# Patient Record
Sex: Female | Born: 1985 | Hispanic: No | Marital: Married | Smoking: Never smoker
Health system: Southern US, Community
[De-identification: ages and names within clinical notes are randomized; demographics above are authoritative.]

## PROBLEM LIST (undated history)

## (undated) DIAGNOSIS — R42 Dizziness and giddiness: Secondary | ICD-10-CM

## (undated) DIAGNOSIS — I10 Essential (primary) hypertension: Secondary | ICD-10-CM

## (undated) DIAGNOSIS — E785 Hyperlipidemia, unspecified: Secondary | ICD-10-CM

## (undated) DIAGNOSIS — R079 Chest pain, unspecified: Secondary | ICD-10-CM

## (undated) DIAGNOSIS — R519 Headache, unspecified: Secondary | ICD-10-CM

## (undated) HISTORY — DX: Headache, unspecified: R51.9

## (undated) HISTORY — DX: Dizziness and giddiness: R42

## (undated) HISTORY — DX: Hyperlipidemia, unspecified: E78.5

## (undated) HISTORY — DX: Chest pain, unspecified: R07.9

---

## 2019-03-28 ENCOUNTER — Other Ambulatory Visit: Payer: Self-pay

## 2019-03-28 ENCOUNTER — Emergency Department (HOSPITAL_COMMUNITY)
Admission: EM | Admit: 2019-03-28 | Discharge: 2019-03-28 | Disposition: A | Discharge status: Home / Self Care | Payer: Self-pay | Attending: Emergency Medicine | Admitting: Emergency Medicine

## 2019-03-28 ENCOUNTER — Encounter (HOSPITAL_COMMUNITY): Payer: Self-pay | Admitting: Emergency Medicine

## 2019-03-28 DIAGNOSIS — F4321 Adjustment disorder with depressed mood: Secondary | ICD-10-CM | POA: Insufficient documentation

## 2019-03-28 DIAGNOSIS — Z659 Problem related to unspecified psychosocial circumstances: Secondary | ICD-10-CM | POA: Insufficient documentation

## 2019-03-28 DIAGNOSIS — I10 Essential (primary) hypertension: Secondary | ICD-10-CM | POA: Insufficient documentation

## 2019-03-28 DIAGNOSIS — R51 Headache: Secondary | ICD-10-CM | POA: Insufficient documentation

## 2019-03-28 DIAGNOSIS — R519 Headache, unspecified: Secondary | ICD-10-CM

## 2019-03-28 HISTORY — DX: Essential (primary) hypertension: I10

## 2019-03-28 LAB — CBC WITH DIFFERENTIAL/PLATELET
Abs Immature Granulocytes: 0.02 10*3/uL (ref 0.00–0.07)
Basophils Absolute: 0 10*3/uL (ref 0.0–0.1)
Basophils Relative: 0 %
Eosinophils Absolute: 0.1 10*3/uL (ref 0.0–0.5)
Eosinophils Relative: 1 %
HCT: 39.4 % (ref 36.0–46.0)
Hemoglobin: 12.8 g/dL (ref 12.0–15.0)
Immature Granulocytes: 0 %
Lymphocytes Relative: 41 %
Lymphs Abs: 3.5 10*3/uL (ref 0.7–4.0)
MCH: 27.9 pg (ref 26.0–34.0)
MCHC: 32.5 g/dL (ref 30.0–36.0)
MCV: 85.8 fL (ref 80.0–100.0)
Monocytes Absolute: 0.4 10*3/uL (ref 0.1–1.0)
Monocytes Relative: 5 %
Neutro Abs: 4.5 10*3/uL (ref 1.7–7.7)
Neutrophils Relative %: 53 %
Platelets: 299 10*3/uL (ref 150–400)
RBC: 4.59 MIL/uL (ref 3.87–5.11)
RDW: 12.3 % (ref 11.5–15.5)
WBC: 8.6 10*3/uL (ref 4.0–10.5)
nRBC: 0 % (ref 0.0–0.2)

## 2019-03-28 LAB — COMPREHENSIVE METABOLIC PANEL
ALT: 19 U/L (ref 0–44)
AST: 16 U/L (ref 15–41)
Albumin: 3.8 g/dL (ref 3.5–5.0)
Alkaline Phosphatase: 52 U/L (ref 38–126)
Anion gap: 9 (ref 5–15)
BUN: 19 mg/dL (ref 6–20)
CO2: 21 mmol/L — ABNORMAL LOW (ref 22–32)
Calcium: 9 mg/dL (ref 8.9–10.3)
Chloride: 107 mmol/L (ref 98–111)
Creatinine, Ser: 0.7 mg/dL (ref 0.44–1.00)
GFR calc Af Amer: >60 mL/min (ref >60)
GFR calc non Af Amer: >60 mL/min (ref >60)
Glucose, Bld: 108 mg/dL — ABNORMAL HIGH (ref 70–99)
Potassium: 3.9 mmol/L (ref 3.5–5.1)
Sodium: 137 mmol/L (ref 135–145)
Total Bilirubin: 0.6 mg/dL (ref 0.3–1.2)
Total Protein: 6.7 g/dL (ref 6.5–8.1)

## 2019-03-28 LAB — I-STAT BETA HCG BLOOD, ED (MC, WL, AP ONLY): I-stat hCG, quantitative: <5 m[IU]/mL (ref <5)

## 2019-03-28 MED ORDER — PROCHLORPERAZINE EDISYLATE 10 MG/2ML IJ SOLN
5.0000 mg | Freq: Once | INTRAMUSCULAR | Status: AC
  Administered 2019-03-28: 09:00:00 5 mg via INTRAMUSCULAR
  Filled 2019-03-28: qty 2

## 2019-03-28 MED ORDER — DIPHENHYDRAMINE HCL 50 MG/ML IJ SOLN
12.5000 mg | Freq: Once | INTRAMUSCULAR | Status: AC
  Administered 2019-03-28: 09:00:00 12.5 mg via INTRAVENOUS
  Filled 2019-03-28: qty 1

## 2019-03-28 MED ORDER — KETOROLAC TROMETHAMINE 15 MG/ML IJ SOLN
15.0000 mg | Freq: Once | INTRAMUSCULAR | Status: AC
  Administered 2019-03-28: 15 mg via INTRAVENOUS
  Filled 2019-03-28: qty 1

## 2019-03-28 MED ORDER — SODIUM CHLORIDE 0.9 % IV BOLUS
500.0000 mL | Freq: Once | INTRAVENOUS | Status: AC
  Administered 2019-03-28: 500 mL via INTRAVENOUS

## 2019-03-28 NOTE — ED Triage Notes (Signed)
Pt was brought into the ED w/ hypertension and a headache which she describes as "on fire". Pt has been taking her blood pressure medication however her mother died three days ago in [place].  Pt speaks Arabic.  No neuro deficits at this time.  Normal speech.

## 2019-03-28 NOTE — ED Provider Notes (Signed)
[facility] EMERGENCY DEPARTMENT Provider Note   CSN: [id number] Arrival date & time: 03/28/19  0404    History   Chief Complaint Chief Complaint  Patient presents with   Hypertension   Headache    HPI Breanna Simpson is a 33 y.o. female with a past medical history of obesity and hypertension who presents emergency department chief complaint of headache and high blood pressure.  History is limited by language barrier.  Professional translation services are utilized with Arabic translation.  The patient has been in the [place] for left approximately 7 months.  She states that she has been under significant stress.  She is a mother of 5 children.  She has been having some family stress with her husband.  She also lost her mother this year and has been grieving her loss.  She complains of daily headaches over the top of her head which she describes as aching.  She normally takes ibuprofen which improves the headache however it then returns.  She denies changes in vision, nausea, vomiting, photophobia, phonophobia.  She has not seen a doctor or neurologist about these headaches in the past.  She does get frequent tonsillitis and states that her headaches seem to worsen with them.  She is currently being treated with Augmentin for tonsillitis and denies any active sore throat pain.  She gets 3-4 episodes of tonsillitis yearly.  Patient states that she took her antihypertensive medication Diovan last night around midnight.  When she awoke this morning she felt as though her blood pressure was high.  She had a headache which she describes as "like tingling fire over the top of my head.  She felt heaviness in her tongue and had what she felt was racing heart on the left side of her chest along with left arm pain.  This lasted for only a short time and then resolved.  She denies any active chest pain.  She denies any rashes on her scalp.  She denies changes in vision, unilateral leg weakness,  difficulty with speech or swallowing.  She currently rates her headache at about a 7 out of 10.     HPI  Past Medical History:  Diagnosis Date   Hypertension     There are no active problems to display for this patient.   History reviewed. No pertinent surgical history.   OB History   No obstetric history on file.      Home Medications    Prior to Admission medications   Not on File    Family History No family history on file.  Social History Social History   Tobacco Use   Smoking status: Not on file  Substance Use Topics   Alcohol use: Not on file   Drug use: Not on file     Allergies   Patient has no known allergies.   Review of Systems Review of Systems  Ten systems reviewed and are negative for acute change, except as noted in the HPI.   Physical Exam Updated Vital Signs BP (!) 111/56    Pulse 72    Temp 99 F (37.2 C) (Oral)    Resp 14    Ht 5\' 3"  (1.6 m)    Wt 95 kg    SpO2 95%    BMI 37.10 kg/m   Physical Exam Vitals signs and nursing note reviewed.  Constitutional:      General: She is not in acute distress.    Appearance: She is well-developed. She  is not diaphoretic.  HENT:     Head: Normocephalic and atraumatic.  Eyes:     General: No scleral icterus.    Conjunctiva/sclera: Conjunctivae normal.     Pupils: Pupils are equal, round, and reactive to light.     Comments: No horizontal, vertical or rotational nystagmus  Neck:     Musculoskeletal: Normal range of motion and neck supple.     Comments: Full active and passive ROM without pain No midline or paraspinal tenderness No nuchal rigidity or meningeal signs Cardiovascular:     Rate and Rhythm: Normal rate and regular rhythm.  Pulmonary:     Effort: Pulmonary effort is normal. No respiratory distress.     Breath sounds: Normal breath sounds. No wheezing or rales.  Abdominal:     General: Bowel sounds are normal.     Palpations: Abdomen is soft.     Tenderness: There is no  abdominal tenderness. There is no guarding or rebound.  Musculoskeletal: Normal range of motion.  Lymphadenopathy:     Cervical: No cervical adenopathy.  Skin:    General: Skin is warm and dry.     Findings: No rash.  Neurological:     Mental Status: She is alert and oriented to person, place, and time.     Cranial Nerves: No cranial nerve deficit.     Motor: No abnormal muscle tone.     Coordination: Coordination normal.     Deep Tendon Reflexes: Reflexes are normal and symmetric.     Comments: Mental Status:  Alert, oriented, thought content appropriate. Speech fluent without evidence of aphasia. Able to follow 2 step commands without difficulty.  Cranial Nerves:  II:  Peripheral visual fields grossly normal, pupils equal, round, reactive to light III,IV, VI: ptosis not present, extra-ocular motions intact bilaterally  V,VII: smile symmetric, facial light touch sensation equal VIII: hearing grossly normal bilaterally  IX,X: midline uvula rise  XI: bilateral shoulder shrug equal and strong XII: midline tongue extension  Motor:  5/5 in upper and lower extremities bilaterally including strong and equal grip strength and dorsiflexion/plantar flexion Sensory: Pinprick and light touch normal in all extremities.  Deep Tendon Reflexes: 2+ and symmetric  Cerebellar: normal finger-to-nose with bilateral upper extremities Gait: normal gait and balance CV: distal pulses palpable throughout   Psychiatric:        Behavior: Behavior normal.        Thought Content: Thought content normal.        Judgment: Judgment normal.      ED Treatments / Results  Labs (all labs ordered are listed, but only abnormal results are displayed) Labs Reviewed  COMPREHENSIVE METABOLIC PANEL - Abnormal; Notable for the following components:      Result Value   CO2 21 (*)    Glucose, Bld 108 (*)    All other components within normal limits  CBC WITH DIFFERENTIAL/PLATELET  I-STAT BETA HCG BLOOD, ED ([facility],  [facility], [facility] ONLY)    EKG None  Radiology No results found.  Procedures Procedures (including critical care time)  Medications Ordered in ED Medications  sodium chloride 0.9 % bolus 500 mL (0 mLs Intravenous Stopped 03/28/19 1005)  prochlorperazine (COMPAZINE) injection 5 mg (5 mg Intramuscular Given 03/28/19 0904)  diphenhydrAMINE (BENADRYL) injection 12.5 mg (12.5 mg Intravenous Given 03/28/19 0904)  ketorolac (TORADOL) 15 MG/ML injection 15 mg (15 mg Intravenous Given 03/28/19 1006)     Initial Impression / Assessment and Plan / ED Course  I have reviewed the triage  vital signs and the nursing notes.  Pertinent labs & imaging results that were available during my care of the patient were reviewed by me and considered in my medical decision making (see chart for details).  Clinical Course as of Mar 28 1031  Tue Mar 28, 2019  0930 I-Stat beta hCG blood, ED [AH]    Clinical Course User Index [AH] Arthor CaptainHarris, Taisa Deloria, PA-C       Breanna Simpson is a 33 y.o. female who presents to ED for  headache. No focal neuro deficits on exam.  Migraine cocktail and fluids given. She has complete resolution of sxs at this time  On re-evaluation, patient feels much better. The patient denies any neurologic symptoms such as visual changes, focal numbness/weakness, balance problems, confusion, or speech difficulty to suggest a life-threatening intracranial process such as intracranial hemorrhage or mass. The patient has no clotting risk factors thus venous sinus thrombosis is unlikely. No fevers, neck pain or nuchal rigidity to suggest meningitis. I feel that the patient is safe for discharge home at this time. PCP follow up strongly encouraged. I have reviewed return precautions including development of neurologic symptoms, confusion, lethargy, difficulty speaking, or new/worsening/concerning symptoms. All questions answered.   Final Clinical Impressions(s) / ED Diagnoses   Final diagnoses:  Bad  headache  Other social stressor  Feeling grief    ED Discharge Orders    None       Arthor CaptainHarris, Khalie Wince, PA-C 03/28/19 1032    Raeford RazorKohut, Stephen, MD 03/29/19 0730

## 2019-03-28 NOTE — Discharge Instructions (Signed)

## 2019-03-28 NOTE — ED Notes (Signed)
Patient verbalizes understanding of discharge instructions. Opportunity for questioning and answers were provided. Armband removed by staff, pt discharged from ED.  

## 2020-06-06 DIAGNOSIS — I10 Essential (primary) hypertension: Secondary | ICD-10-CM | POA: Insufficient documentation

## 2020-10-31 ENCOUNTER — Emergency Department (HOSPITAL_COMMUNITY)
Admission: EM | Admit: 2020-10-31 | Discharge: 2020-10-31 | Disposition: A | Discharge status: Home / Self Care | Payer: 59 | Attending: Emergency Medicine | Admitting: Emergency Medicine

## 2020-10-31 ENCOUNTER — Emergency Department (HOSPITAL_COMMUNITY): Payer: 59

## 2020-10-31 ENCOUNTER — Encounter (HOSPITAL_COMMUNITY): Payer: Self-pay | Admitting: Emergency Medicine

## 2020-10-31 DIAGNOSIS — R079 Chest pain, unspecified: Secondary | ICD-10-CM | POA: Insufficient documentation

## 2020-10-31 DIAGNOSIS — Z79899 Other long term (current) drug therapy: Secondary | ICD-10-CM | POA: Insufficient documentation

## 2020-10-31 DIAGNOSIS — I10 Essential (primary) hypertension: Secondary | ICD-10-CM | POA: Diagnosis not present

## 2020-10-31 DIAGNOSIS — R519 Headache, unspecified: Secondary | ICD-10-CM | POA: Insufficient documentation

## 2020-10-31 LAB — BASIC METABOLIC PANEL
Anion gap: 12 (ref 5–15)
BUN: 7 mg/dL (ref 6–20)
CO2: 22 mmol/L (ref 22–32)
Calcium: 9.4 mg/dL (ref 8.9–10.3)
Chloride: 107 mmol/L (ref 98–111)
Creatinine, Ser: 0.55 mg/dL (ref 0.44–1.00)
GFR, Estimated: >60 mL/min (ref >60)
Glucose, Bld: 95 mg/dL (ref 70–99)
Potassium: 3.8 mmol/L (ref 3.5–5.1)
Sodium: 141 mmol/L (ref 135–145)

## 2020-10-31 LAB — CBC
HCT: 41.9 % (ref 36.0–46.0)
Hemoglobin: 13.8 g/dL (ref 12.0–15.0)
MCH: 28.2 pg (ref 26.0–34.0)
MCHC: 32.9 g/dL (ref 30.0–36.0)
MCV: 85.5 fL (ref 80.0–100.0)
Platelets: 279 10*3/uL (ref 150–400)
RBC: 4.9 MIL/uL (ref 3.87–5.11)
RDW: 12.5 % (ref 11.5–15.5)
WBC: 8.8 10*3/uL (ref 4.0–10.5)
nRBC: 0 % (ref 0.0–0.2)

## 2020-10-31 LAB — I-STAT BETA HCG BLOOD, ED (MC, WL, AP ONLY): I-stat hCG, quantitative: <5 m[IU]/mL (ref <5)

## 2020-10-31 LAB — TROPONIN I (HIGH SENSITIVITY): Troponin I (High Sensitivity): 3 ng/L (ref <18)

## 2020-10-31 MED ORDER — BUTALBITAL-APAP-CAFFEINE 50-325-40 MG PO TABS: 1.0000 | ORAL_TABLET | Freq: Four times a day (QID) | ORAL | 0 refills | Status: AC | PRN

## 2020-10-31 MED ORDER — DIPHENHYDRAMINE HCL 25 MG PO CAPS
25.0000 mg | ORAL_CAPSULE | Freq: Once | ORAL | Status: AC
  Administered 2020-10-31: 25 mg via ORAL
  Filled 2020-10-31: qty 1

## 2020-10-31 MED ORDER — PROCHLORPERAZINE MALEATE 5 MG PO TABS
5.0000 mg | ORAL_TABLET | Freq: Once | ORAL | Status: AC
  Administered 2020-10-31: 5 mg via ORAL
  Filled 2020-10-31: qty 1

## 2020-10-31 MED ORDER — IBUPROFEN 400 MG PO TABS
600.0000 mg | ORAL_TABLET | Freq: Once | ORAL | Status: AC
  Administered 2020-10-31: 600 mg via ORAL
  Filled 2020-10-31: qty 1

## 2020-10-31 MED ORDER — IRBESARTAN 150 MG PO TABS: 150.0000 mg | ORAL_TABLET | Freq: Every day | ORAL | 0 refills | Status: DC

## 2020-10-31 MED ORDER — ACETAMINOPHEN 500 MG PO TABS
1000.0000 mg | ORAL_TABLET | Freq: Once | ORAL | Status: AC
  Administered 2020-10-31: 1000 mg via ORAL
  Filled 2020-10-31: qty 2

## 2020-10-31 NOTE — Discharge Instructions (Addendum)
You were seen in the ER for headache, chest pain, ear pain, tongue and leg heaviness, elevated blood pressure reading  All ER work up today was normal  You chose oral headache medicines over IV or injection of headache medicines  Your blood pressure improved here  The cause of your symptoms is unclear - it may be related to a virus, intermittent high blood pressures, stress, etc   I have increased your blood pressure medicine. Take 150 mg daily by mouth.  If you develop a headache or chest pain, take 600 mg ibuprofen and/or 650 mg acetaminophen.  Rest. If headache is still present, take Fioricet 1 pill.  Rest.  Check your blood pressure when you are not having any pain - pain will make blood pressure be high. Sit for 5 min with feet touching the ground, and make sure you are using the correct blood pressure cuff size Call your primary care doctor and make an appointment in 1 week for more discussion of your headaches, chest pain and blood pressure. You may need more changes in blood pressure medicine, cardiology or neurology referrals. Return for sudden, severe, constant headache, stroke symptoms, fevers Return for chest pain or exertion with exertion, cough

## 2020-10-31 NOTE — ED Triage Notes (Signed)
Pt here from a [facility] with c/o cp , feels like a burning in her chest , no sob or nausea , hx of htn

## 2020-10-31 NOTE — ED Provider Notes (Signed)
[facility] EMERGENCY DEPARTMENT Provider Note   CSN: [id number] Arrival date & time: 10/31/20  1356     History No chief complaint on file.   Breanna Simpson is a 35 y.o. female with past medical history of hypertension presents to the ED for evaluation of several concerns.  States last night around midnight she was trying to sleep in bed in developed headache, chest pain.  She felt her heart racing.  Noticed that her tongue felt "heavy" and her legs felt heavy as well.  She checked her blood pressure and it was high but does not remember the number.  She took 2 of her blood pressure medicines irbesartan and 1 baby aspirin.  States she was not able to fall asleep.  Woke up this morning and around 9 AM had return of symptoms so she took one of her blood pressure medicines and a baby aspirin again.  States her blood pressure went down but her symptoms have persisted.  Continues to have chest pain and headache now 6/10.  Describes her headache as frontal, pressure, gradual.  Denies head trauma, visual changes, fevers, neck pain, stroke symptoms.  Her chest pain is central with some radiation to the left breast described as a squeeze, constant.  Chest pain is worse when she lays down.  No changes in chest pain with deep breathing, eating or exertion.  Patient states she has had similar symptoms at least 3-4 times in the last few years.  Came to the ED 1 year ago with headache, chest pain and "heaviness" of her tongue.  She was given a migraine cocktail and her symptoms improved.  She has a primary care doctor at [facility] who prescribes her her blood pressure medicine.  She has never had a formal cardiology or neurology evaluation for her headaches or chest pains.  States 1 week ago she had a sore throat, runny and congested nose, nausea, subjective fevers.  Her daughter tested positive for Covid January 15.  She had 2 Covid vaccines April 2021 and no booster.   States she has had 3 Covid test so far and they are negative.  Went to urgent care earlier today for her symptoms and was tested for Covid again and it was negative.  Over the last 2 to 3 days she has noticed a mildly productive cough with clear phlegm.  Denies fever, shortness of breath, hemoptysis.  No nausea, vomiting, abdominal pain, diarrhea.  No changes in taste or smell. Also having bilateral ear pain.  No interventions. BP is 139/92 during encounter.   HPI     Past Medical History:  Diagnosis Date  . Hypertension     There are no problems to display for this patient.   History reviewed. No pertinent surgical history.   OB History   No obstetric history on file.     No family history on file.     Home Medications Prior to Admission medications   Medication Sig Start Date End Date Taking? Authorizing Provider  butalbital-acetaminophen-caffeine (FIORICET) 50-325-40 MG tablet Take 1 tablet by mouth every 6 (six) hours as needed for headache. 10/31/20 10/31/21 Yes Liberty Handy, PA-C  irbesartan (AVAPRO) 150 MG tablet Take 1 tablet (150 mg total) by mouth daily. 10/31/20 11/30/20 Yes Liberty Handy, PA-C    Allergies    Patient has no known allergies.  Review of Systems   Review of Systems  Constitutional: Positive for fever (subjective, resolved).  HENT: Positive  for congestion, ear pain (bilateral), rhinorrhea and sore throat (resolved).   Respiratory: Positive for cough.   Cardiovascular: Positive for chest pain.  Neurological: Positive for headaches.    Physical Exam Updated Vital Signs BP 127/77   Pulse 65   Temp 98.7 F (37.1 C) (Oral)   Resp (!) 23   Ht 5\' 4"  (1.626 m)   Wt 93 kg   LMP 10/31/2020 (Exact Date)   SpO2 100%   BMI 35.19 kg/m   Physical Exam Constitutional:      Appearance: She is well-developed.     Comments: NAD. Non toxic. Husband at bedside   HENT:     Head: Normocephalic and atraumatic.     Ears:     Comments: Minimal  white effusion bilaterally, still able to visualize bony landmark. No TM bulging, erythema, perforation. Middle ear/external ear normal     Nose: Nose normal.     Mouth/Throat:     Comments: MMM. Oropharynx and tonsils normal, no erythema.  Eyes:     General: Lids are normal.     Conjunctiva/sclera: Conjunctivae normal.  Neck:     Trachea: Trachea normal.     Comments: Trachea midline.  Cardiovascular:     Rate and Rhythm: Normal rate and regular rhythm.     Pulses:          Radial pulses are 1+ on the right side and 1+ on the left side.       Dorsalis pedis pulses are 1+ on the right side and 1+ on the left side.     Heart sounds: Normal heart sounds, S1 normal and S2 normal.     Comments: No murmurs. No LE edema or calf tenderness.  Pulmonary:     Effort: Pulmonary effort is normal.     Breath sounds: Normal breath sounds.     Comments: Left substernal chest wall tenderness, skin normal over this area Chest:     Chest wall: Tenderness present.  Abdominal:     General: Bowel sounds are normal.     Palpations: Abdomen is soft.     Tenderness: There is no abdominal tenderness.     Comments: No epigastric or upper abdominal tenderness.  Musculoskeletal:     Cervical back: Normal range of motion.  Skin:    General: Skin is warm and dry.     Capillary Refill: Capillary refill takes less than 2 seconds.     Comments: No rash to chest wall  Neurological:     Mental Status: She is alert.     GCS: GCS eye subscore is 4. GCS verbal subscore is 5. GCS motor subscore is 6.     Comments:  Awake, alert. Speech appears clear - speaking in Arabic. Sensation to light touch intact in face, upper/lower extremities. Strength equal and symmetric bilaterally. No arm or leg drop/drift. Normal FTN and HTS. CN 2-12 intact.   Psychiatric:        Speech: Speech normal.        Behavior: Behavior normal.        Thought Content: Thought content normal.     ED Results / Procedures / Treatments    Labs (all labs ordered are listed, but only abnormal results are displayed) Labs Reviewed  BASIC METABOLIC PANEL  CBC  I-STAT BETA HCG BLOOD, ED ([facility], [facility], [facility] ONLY)  TROPONIN I (HIGH SENSITIVITY)  TROPONIN I (HIGH SENSITIVITY)    EKG EKG Interpretation  Date/Time:  Thursday October 31 2020 14:08:10 EST  Ventricular Rate:  67 PR Interval:  132 QRS Duration: 74 QT Interval:  408 QTC Calculation: 431 R Axis:   20 Text Interpretation: Normal sinus rhythm Cannot rule out Anterior infarct , age undetermined Abnormal ECG No STEMI Confirmed by Alona Bene [phone]) on 10/31/2020 5:49:01 PM   Radiology DG Chest 2 View  Result Date: 10/31/2020 CLINICAL DATA:  Chest pain EXAM: CHEST - 2 VIEW COMPARISON:  None. FINDINGS: Lungs are clear. Heart size and pulmonary vascularity are normal. No adenopathy. No pneumothorax. No bone lesions. Surgical clips noted in right upper abdomen. IMPRESSION: Lungs clear.  Cardiac silhouette normal. Electronically Signed   By: Bretta Bang III M.D.   On: 10/31/2020 14:50    Procedures Procedures   Medications Ordered in ED Medications  acetaminophen (TYLENOL) tablet 1,000 mg (1,000 mg Oral Given 10/31/20 1906)  ibuprofen (ADVIL) tablet 600 mg (600 mg Oral Given 10/31/20 1906)  prochlorperazine (COMPAZINE) tablet 5 mg (5 mg Oral Given 10/31/20 1906)  diphenhydrAMINE (BENADRYL) capsule 25 mg (25 mg Oral Given 10/31/20 1906)    ED Course  I have reviewed the triage vital signs and the nursing notes.  Pertinent labs & imaging results that were available during my care of the patient were reviewed by me and considered in my medical decision making (see chart for details).    MDM Rules/Calculators/A&P                           35 y.o. yo with chief complaint of headache, chest pain, bilateral ear pain, tongue and bilateral leg "heaviness" as well as elevated blood pressure reading since last night.  Reports recurrent headache, chest pain and elevated blood  pressure 3-4 times in the past few years.  Blood pressure readings at home in the 170 systolic.  She took blood pressure medicine on arrival her blood pressure is significantly improved.  Also reports 1 week ago had URI symptoms and daughter tested positive for Covid.  Previous medical records available, triage and nursing notes reviewed to obtain more history and assist with MDM  EMR reveals patient presented to the ED with elevated blood pressure reading, tongue "heaviness", headache and chest pain 1 year ago.  She received migraine cocktail at that time and symptoms improved.  ER lab work and imaging ordered by triage RN and me, as above  I have personally visualized and interpreted ER diagnostic work up including labs and imaging.    Labs reveal -all normal.  Normal WBC, hemoglobin.  Undetectable troponin.  Patient declined Covid testing.  Imaging reveals -chest x-ray without acute findings.  EKG without ischemia, arrhythmias, signs of right ventricular strain.  No classic signs of pericarditis.  Given chronicity of headache, all other constellation of symptoms considered ICH, stroke highly unlikely today. Will defer neuro imaging.   Medications ordered - Patient reported 6/10 headache and chest pain.  Recommended IV migraine cocktail, anti-inflammatories but she declined and requested oral medicines.  Oral Tylenol, Benadryl, Advil and Compazine given.  Ordered continuous cardiac and pulse ox monitoring.  Will plan for serial re-examinations. Close monitoring.   Re-evaluated the patient.   She had improvement in headache and chest pain.  Blood pressure has completely normalized after pain control.  Overall, clinical presentation and ER work up most suggestive of atypical chest pain, recurring headache/migraine.  Given reassuring exam, chronicity of symptoms over the last few years, vastly reassuring lab work and imaging in the ED with undetectable troponins doubt  ACS, myocarditis, dissection.   PERC negative.?  Pericarditis or muscular chest pain as she has reproducible chest wall tenderness.  She has had headaches for several years and wonder if she has classic migraines.  Discussed with patient I will increase her blood pressure medicine.  Instructed her to not take her blood pressure routinely at home.  Educated patient on how to correctly take her blood pressure at home.  Instructed her to take Fioricet for any headaches as needed.  Recommended PCP follow-up for blood pressure medicine adjustment, she may benefit from referral to neurology for headaches and cardiology for recurrent atypical chest pains.  Return precautions discussed with patient and husband who are in agreement with plan.  Final Clinical Impression(s) / ED Diagnoses Final diagnoses:  Recurrent chest pain  Recurrent headache  Elevated blood pressure reading with diagnosis of hypertension    Rx / DC Orders ED Discharge Orders         Ordered    butalbital-acetaminophen-caffeine (FIORICET) 50-325-40 MG tablet  Every 6 hours PRN        10/31/20 1945    irbesartan (AVAPRO) 150 MG tablet  Daily        10/31/20 1949           Jerrell Mylar 10/31/20 2051    Maia Plan, MD 11/01/20 1153

## 2020-11-25 DIAGNOSIS — E785 Hyperlipidemia, unspecified: Secondary | ICD-10-CM | POA: Insufficient documentation

## 2020-11-25 DIAGNOSIS — R519 Headache, unspecified: Secondary | ICD-10-CM | POA: Insufficient documentation

## 2020-11-25 DIAGNOSIS — Z8639 Personal history of other endocrine, nutritional and metabolic disease: Secondary | ICD-10-CM | POA: Insufficient documentation

## 2020-11-26 ENCOUNTER — Encounter: Payer: Self-pay | Admitting: Neurology

## 2020-11-29 ENCOUNTER — Telehealth: Payer: Self-pay

## 2020-11-29 NOTE — Telephone Encounter (Signed)
NOTES ON FILE FROM  [facility] [phone], SENT REFERRAL TO SCHEDULING

## 2020-12-22 NOTE — Progress Notes (Signed)
Cardiology Office Note:    Date:  12/24/2020   ID:  Breanna Simpson, DOB [birth year], MRN [id number]  PCP:  [facility], [facility]   [facility]  Cardiologist:  No primary care provider on file.  Advanced Practice Provider:  No care team member to display Electrophysiologist:  None    Referring MD: Sherral Hammers, FNP     History of Present Illness:    Breanna Simpson is a 35 y.o. female with a hx of HTN and HLD who was referred by Verdene Lennert for chest pain and palpitations.  Had ER visit on 10/31/20. Note reviewed. Had episode of chest pain, HA and palpitations. Symptoms were non-exertional in nature. Also noted to have Bps in 170s at that time. In the ER, trop negative, CXR without acute findings. ECG with NSR. Received migraine cocktail with improvement and discharged home.  Since being in the ER, her chest pain as resolved but has continued palpitations. Has associated lightheadedness. Each episode lasts about and then goes away. Occurring 2-3 times per week. No chest pain at this time. No LE edema, orthopnea, PND. No exercise limitations. No syncope.   No known family history of heart disease.   Past Medical History:  Diagnosis Date  . Chest pain   . Dizziness   . Headache   . Hyperlipidemia   . Hypertension     History reviewed. No pertinent surgical history.  Current Medications: Current Meds  Medication Sig  . butalbital-acetaminophen-caffeine (FIORICET) 50-325-40 MG tablet Take 1 tablet by mouth every 6 (six) hours as needed for headache.  . cetirizine (ZYRTEC) 10 MG tablet Take 10 mg by mouth daily.  . irbesartan (AVAPRO) 150 MG tablet Take 1 tablet (150 mg total) by mouth daily.     Allergies:   Patient has no known allergies.   Social History   Socioeconomic History  . Marital status: Married    Spouse name: Not on file  . Number of children: Not on file  . Years of education: Not on file  .  Highest education level: Not on file  Occupational History  . Not on file  Tobacco Use  . Smoking status: Never Smoker  . Smokeless tobacco: Never Used  Substance and Sexual Activity  . Alcohol use: Never  . Drug use: Never  . Sexual activity: Not on file  Other Topics Concern  . Not on file  Social History Narrative  . Not on file   Social Determinants of Health   Financial Resource Strain: Not on file  Food Insecurity: Not on file  Transportation Needs: Not on file  Physical Activity: Not on file  Stress: Not on file  Social Connections: Not on file     Family History: The patient's family history includes Cancer - Colon in her father; Hypertension in her mother.  ROS:   Please see the history of present illness.    Review of Systems  Constitutional: Negative for chills and fever.  HENT: Negative for hearing loss and sore throat.   Eyes: Negative for blurred vision and redness.  Respiratory: Positive for shortness of breath. Negative for cough.   Cardiovascular: Positive for palpitations. Negative for chest pain, orthopnea, claudication, leg swelling and PND.  Gastrointestinal: Negative for melena, nausea and vomiting.  Genitourinary: Negative for dysuria and flank pain.  Musculoskeletal: Negative for falls and myalgias.  Neurological: Negative for dizziness and loss of consciousness.  Endo/Heme/Allergies: Negative for polydipsia.  Psychiatric/Behavioral: Negative for substance  abuse.    EKGs/Labs/Other Studies Reviewed:    The following studies were reviewed today: No cardiac studies  EKG:  EKG 11/01/20; NSR with no ischemia or block  Recent Labs: 10/31/2020: BUN 7; Creatinine, Ser 0.55; Hemoglobin 13.8; Platelets 279; Potassium 3.8; Sodium 141  Recent Lipid Panel No results found for: CHOL, TRIG, HDL, CHOLHDL, VLDL, LDLCALC, LDLDIRECT    Physical Exam:    VS:  BP 118/80   Pulse 63   Ht 5\' 3"  (1.6 m)   Wt 209 lb 6.4 oz (95 kg)   SpO2 99%   BMI 37.09  kg/m     Wt Readings from Last 3 Encounters:  12/24/20 209 lb 6.4 oz (95 kg)  10/31/20 205 lb (93 kg)  03/28/19 209 lb 7 oz (95 kg)     GEN:  Well nourished, well developed in no acute distress HEENT: Normal NECK: No JVD; No carotid bruits CARDIAC: RRR, no murmurs, rubs, gallops RESPIRATORY:  Clear to auscultation without rales, wheezing or rhonchi  ABDOMEN: Soft, non-tender, non-distended MUSCULOSKELETAL:  No edema; No deformity  SKIN: Warm and dry NEUROLOGIC:  Alert and oriented x 3 PSYCHIATRIC:  Normal affect   ASSESSMENT:    1. Palpitations   2. Chest pain of uncertain etiology    PLAN:    In order of problems listed above:  #Palpitations: Patient with episodes of palpitations with associated lightheadedness that has been ongoing for the past couple of months. Each episode comes on suddenly and lasts about 03/30/19 before going away. Has been occurring about 2-3 times per week. Symptoms are not exertional. No associated SOB or syncope. -Check 14 day zio  #Atypical Chest Pain: Non-exertional and has since resolved. No further episodes and ER work-up including troponin, ECG and CXR reassuring. -Continue to monitor   Medication Adjustments/Labs and Tests Ordered: Current medicines are reviewed at length with the patient today.  Concerns regarding medicines are outlined above.  Orders Placed This Encounter  Procedures  . LONG TERM MONITOR (3-14 DAYS)   No orders of the defined types were placed in this encounter.   Patient Instructions  Medication Instructions:  Your provider recommends that you continue on your current medications as directed. Please refer to the Current Medication list given to you today.   *If you need a refill on your cardiac medications before your next appointment, please call your pharmacy*  Testing/Procedures: Dr. recommends you wear a HEART MONITOR for 14 days. This will be mailed to your home. Leave the monitor on at all  times.  Follow-Up: Your provider recommends that you schedule a follow-up appointment AS NEEDED pending study results.   ZIO XT- Long Term Monitor Instructions   Your physician has requested you wear your ZIO patch monitor 14 days.   This is a single patch monitor.  [employer] supplies one patch monitor per enrollment.  Additional stickers are not available.   Please do not apply patch if you will be having a Nuclear Stress Test, Echocardiogram, Cardiac CT, MRI, or Chest Xray during the time frame you would be wearing the monitor. The patch cannot be worn during these tests.  You cannot remove and re-apply the ZIO XT patch monitor.   Your ZIO patch monitor will be sent USPS Priority mail from [facility] directly to your home address. The monitor may also be mailed to a PO BOX if home delivery is not available.   It may take 3-5 days to receive your monitor after you have been enrolled.  Once you have received you monitor, please review enclosed instructions.  Your monitor has already been registered assigning a specific monitor serial # to you.   Applying the monitor   Shave hair from upper left chest.   Hold abrader disc by orange tab.  Rub abrader in 40 strokes over left upper chest as indicated in your monitor instructions.   Clean area with 4 enclosed alcohol pads .  Use all pads to assure are is cleaned thoroughly.  Let dry.   Apply patch as indicated in monitor instructions.  Patch will be place under collarbone on left side of chest with arrow pointing upward.   Rub patch adhesive wings for 2 minutes.Remove white label marked "1".  Remove white label marked "2".  Rub patch adhesive wings for 2 additional minutes.   While looking in a mirror, press and release button in center of patch.  A small green light will flash 3-4 times .  This will be your only indicator the monitor has been turned on.     Do not shower for the first 24 hours.  You may shower after the first 24  hours.   Press button if you feel a symptom. You will hear a small click.  Record Date, Time and Symptom in the Patient Log Book.   When you are ready to remove patch, follow instructions on last 2 pages of Patient Log Book.  Stick patch monitor onto last page of Patient Log Book.   Place Patient Log Book in [place] box.  Use locking tab on box and tape box closed securely.  The [employer] and [employer] has [employer] on it.  Please place in mailbox as soon as possible.  Your physician should have your test results approximately 7 days after the monitor has been mailed back to [facility].   Call [facility] Customer Care at [phone] if you have questions regarding your ZIO XT patch monitor.  Call them immediately if you see an orange light blinking on your monitor.   If your monitor falls off in less than 4 days contact our Monitor department at [phone].  If your monitor becomes loose or falls off after 4 days call [employer] at [phone] for suggestions on securing your monitor.      Signed, Meriam Sprague, MD  12/24/2020 1:01 PM    [facility]

## 2020-12-24 ENCOUNTER — Other Ambulatory Visit: Payer: Self-pay

## 2020-12-24 ENCOUNTER — Encounter: Payer: Self-pay | Admitting: *Deleted

## 2020-12-24 ENCOUNTER — Ambulatory Visit (INDEPENDENT_AMBULATORY_CARE_PROVIDER_SITE_OTHER): Payer: 59 | Admitting: Cardiology

## 2020-12-24 ENCOUNTER — Encounter: Payer: Self-pay | Admitting: Cardiology

## 2020-12-24 ENCOUNTER — Ambulatory Visit (INDEPENDENT_AMBULATORY_CARE_PROVIDER_SITE_OTHER): Payer: 59

## 2020-12-24 VITALS — BP 118/80 | HR 63 | Ht 63.0 in | Wt 209.4 lb

## 2020-12-24 DIAGNOSIS — R002 Palpitations: Secondary | ICD-10-CM | POA: Diagnosis not present

## 2020-12-24 DIAGNOSIS — R079 Chest pain, unspecified: Secondary | ICD-10-CM | POA: Diagnosis not present

## 2020-12-24 NOTE — Progress Notes (Signed)
Patient ID: Breanna Simpson, female   DOB: 1986/02/15, 35 y.o.   MRN: [id number] [employer] (ZIO XT), is not in network with patients insurance, [employer]. Patient will be enrolled for [employer] to ship a 14 day long term holter monitor to her home.  [employer] is in network with [employer].

## 2020-12-24 NOTE — Progress Notes (Signed)
Confirmed with monitor technician the patient will receive instructions in Arabic.

## 2020-12-24 NOTE — Patient Instructions (Signed)
Medication Instructions:  Your provider recommends that you continue on your current medications as directed. Please refer to the Current Medication list given to you today.   *If you need a refill on your cardiac medications before your next appointment, please call your pharmacy*  Testing/Procedures: Dr. Shari Prows recommends you wear a HEART MONITOR for 14 days. This will be mailed to your home. Leave the monitor on at all times.  Follow-Up: Your provider recommends that you schedule a follow-up appointment AS NEEDED pending study results.   ZIO XT- Long Term Monitor Instructions   Your physician has requested you wear your ZIO patch monitor 14 days.   This is a single patch monitor.  [employer] supplies one patch monitor per enrollment.  Additional stickers are not available.   Please do not apply patch if you will be having a Nuclear Stress Test, Echocardiogram, Cardiac CT, MRI, or Chest Xray during the time frame you would be wearing the monitor. The patch cannot be worn during these tests.  You cannot remove and re-apply the ZIO XT patch monitor.   Your ZIO patch monitor will be sent USPS Priority mail from [facility] directly to your home address. The monitor may also be mailed to a PO BOX if home delivery is not available.   It may take 3-5 days to receive your monitor after you have been enrolled.   Once you have received you monitor, please review enclosed instructions.  Your monitor has already been registered assigning a specific monitor serial # to you.   Applying the monitor   Shave hair from upper left chest.   Hold abrader disc by orange tab.  Rub abrader in 40 strokes over left upper chest as indicated in your monitor instructions.   Clean area with 4 enclosed alcohol pads .  Use all pads to assure are is cleaned thoroughly.  Let dry.   Apply patch as indicated in monitor instructions.  Patch will be place under collarbone on left side of chest with arrow  pointing upward.   Rub patch adhesive wings for 2 minutes.Remove white label marked "1".  Remove white label marked "2".  Rub patch adhesive wings for 2 additional minutes.   While looking in a mirror, press and release button in center of patch.  A small green light will flash 3-4 times .  This will be your only indicator the monitor has been turned on.     Do not shower for the first 24 hours.  You may shower after the first 24 hours.   Press button if you feel a symptom. You will hear a small click.  Record Date, Time and Symptom in the Patient Log Book.   When you are ready to remove patch, follow instructions on last 2 pages of Patient Log Book.  Stick patch monitor onto last page of Patient Log Book.   Place Patient Log Book in [place] box.  Use locking tab on box and tape box closed securely.  The [employer] and [employer] has [employer] on it.  Please place in mailbox as soon as possible.  Your physician should have your test results approximately 7 days after the monitor has been mailed back to [facility].   Call [facility] Customer Care at [phone] if you have questions regarding your ZIO XT patch monitor.  Call them immediately if you see an orange light blinking on your monitor.   If your monitor falls off in less than 4 days contact our Monitor  department at [phone].  If your monitor becomes loose or falls off after 4 days call [employer] at [phone] for suggestions on securing your monitor.

## 2021-01-14 DIAGNOSIS — R002 Palpitations: Secondary | ICD-10-CM

## 2021-01-30 ENCOUNTER — Ambulatory Visit: Payer: 59 | Admitting: Neurology

## 2021-01-31 ENCOUNTER — Telehealth: Payer: Self-pay | Admitting: Cardiology

## 2021-01-31 DIAGNOSIS — R9431 Abnormal electrocardiogram [ECG] [EKG]: Secondary | ICD-10-CM

## 2021-01-31 DIAGNOSIS — R002 Palpitations: Secondary | ICD-10-CM

## 2021-01-31 DIAGNOSIS — I493 Ventricular premature depolarization: Secondary | ICD-10-CM

## 2021-01-31 DIAGNOSIS — R079 Chest pain, unspecified: Secondary | ICD-10-CM

## 2021-01-31 NOTE — Telephone Encounter (Signed)
Left message to call back. Unable to leave detailed message because no DPR on file for husband. Ask that wife/patient call us back.

## 2021-01-31 NOTE — Telephone Encounter (Signed)
    Pt's husband returning call to get heart monitor result, he said he is at work if he not able to answer the call to leave him a detailed message about result

## 2021-02-02 NOTE — Progress Notes (Signed)
NEUROLOGY CONSULTATION NOTE  Breanna Simpson MRN: [id number] DOB: August 23, 1986  Referring provider: Verlon Au, MD Primary care provider: Verlon Au, MD  Reason for consult:  headache  Assessment/Plan:   1.  Headache - unclear etiology.  At this point, I cannot say that these are migraines.  The chest pain and palpitations would not be associated with this. 2.  Palpitations  1.  Will check MRI of brain with and without contrast, however agree with continuing cardiac workup at this time. 2.  Follow up after testing.   Subjective:  Breanna Simpson is a 35 year old female with HTN who presents for headache.  History supplemented by ED and referring provider's notes.  Arabic speaking interpreter present.    On the night of 10/30/2020, she was in bed when she developed sudden onset of headache, chest pain, dizziness and palpitations.  Her tongue and legs also felt "heavy".  Ears felt hot.  Blood pressure was elevated.  The next morning, she continued to have symptoms.  Blood pressure at home was reportedly 170 systolic and she took her blood pressure medication.  Labs, including troponin, were normal  CXR and EKG negative for acute process.  Headache was thought to be migraine and she was treated with migraine cocktail.  She had a recurrent episode 2 days ago but lasting only 15 minutes.  She has had other brief episodes of palpitations and lightheadedness.  She saw cardiology.  Heart monitor showed predominantly NSR with occasional PACs/PVCs.    She presented to the ED with similar symptoms (headache, tongue heaviness) in June 2020.  The event in January was her first since then.   Denies history of migraines.   Current medications:  Fioricet, irbesartan, Zyrtec  PAST MEDICAL HISTORY: Past Medical History:  Diagnosis Date  . Chest pain   . Dizziness   . Headache   . Hyperlipidemia   . Hypertension     PAST SURGICAL HISTORY: No past surgical history on  file.  MEDICATIONS: Current Outpatient Medications on File Prior to Visit  Medication Sig Dispense Refill  . butalbital-acetaminophen-caffeine (FIORICET) 50-325-40 MG tablet Take 1 tablet by mouth every 6 (six) hours as needed for headache. 20 tablet 0  . cetirizine (ZYRTEC) 10 MG tablet Take 10 mg by mouth daily.    . irbesartan (AVAPRO) 150 MG tablet Take 1 tablet (150 mg total) by mouth daily. 30 tablet 0   No current facility-administered medications on file prior to visit.    ALLERGIES: No Known Allergies  FAMILY HISTORY: Family History  Problem Relation Age of Onset  . Hypertension Mother   . Cancer - Colon Father     Objective:  Blood pressure 116/81, pulse 70, height 5\' 5"  (1.651 m), weight 209 lb 12.8 oz (95.2 kg), SpO2 99 %. General: No acute distress.  Patient appears well-groomed.   Head:  Normocephalic/atraumatic Eyes:  fundi examined but not visualized Neck: supple, no paraspinal tenderness, full range of motion Back: No paraspinal tenderness Heart: regular rate and rhythm Lungs: Clear to auscultation bilaterally. Vascular: No carotid bruits. Neurological Exam: Mental status: alert and oriented to person, place, and time, recent and remote memory intact, fund of knowledge intact, attention and concentration intact, speech fluent and not dysarthric, language intact. Cranial nerves: CN I: not tested CN II: pupils equal, round and reactive to light, visual fields intact CN III, IV, VI:  full range of motion, no nystagmus, no ptosis CN V: facial sensation intact. CN VII: upper and  lower face symmetric CN VIII: hearing intact CN IX, X: gag intact, uvula midline CN XI: sternocleidomastoid and trapezius muscles intact CN XII: tongue midline Bulk & Tone: normal, no fasciculations. Motor:  muscle strength 5/5 throughout Sensation:  Pinprick, temperature and vibratory sensation intact. Deep Tendon Reflexes:  2+ throughout,  toes downgoing.   Finger to nose testing:   Without dysmetria.   Heel to shin:  Without dysmetria.   Gait:  Normal station and stride.  Romberg negative.    Thank you for allowing me to take part in the care of this patient.  Shon Millet, DO  CC: Payton Emerald, MD

## 2021-02-03 ENCOUNTER — Other Ambulatory Visit: Payer: Self-pay

## 2021-02-03 ENCOUNTER — Ambulatory Visit (INDEPENDENT_AMBULATORY_CARE_PROVIDER_SITE_OTHER): Payer: 59 | Admitting: Neurology

## 2021-02-03 ENCOUNTER — Encounter: Payer: Self-pay | Admitting: Neurology

## 2021-02-03 VITALS — BP 116/81 | HR 70 | Ht 65.0 in | Wt 209.8 lb

## 2021-02-03 DIAGNOSIS — R519 Headache, unspecified: Secondary | ICD-10-CM

## 2021-02-03 DIAGNOSIS — R002 Palpitations: Secondary | ICD-10-CM

## 2021-02-03 DIAGNOSIS — R079 Chest pain, unspecified: Secondary | ICD-10-CM | POA: Diagnosis not present

## 2021-02-03 DIAGNOSIS — R42 Dizziness and giddiness: Secondary | ICD-10-CM

## 2021-02-03 NOTE — Patient Instructions (Signed)
1.  MRI of brain with and without contrast 2.  Follow up after testing

## 2021-02-05 NOTE — Telephone Encounter (Signed)
If pt does not return a call back by the end of this business day, will send her a result letter to follow-up with our office, to her mailing address on file.

## 2021-02-05 NOTE — Telephone Encounter (Signed)
-----   Message from Quintella Reichert, MD sent at 01/28/2021  6:26 PM EDT ----- Heart monitor showed occasional extra heart beats from the top and bottom of the heart which are usually benign. Please get a 2D echo to assess LVF for PVCs, have her come in for BMET, Mag and TSH and followup with Dr. Shari Prows

## 2021-02-05 NOTE — Telephone Encounter (Signed)
Pt called back via [facility] CH#[id number], to receive monitor results and recommendations per Dr. Shari Prows. Pt is aware that she will need an echo and labs done same day, to reassess her PVCs noted on her monitor results. Pt is also aware she will need a follow-up appt with Dr. Shari Prows at next available.  Pt voiced via Arabic Interpreter that she will need to have her echo and labs done this month, for she will be leaving to go out of the country in early June, for 2 months.  She also request that all appointments should be made by her husband, for he speaks [place]. Pt is aware that I will go ahead and place the orders for her to get an echo and labs done this month in the system, and have our [facility] Schedulers call her Husband to arrange these appts at [phone].  He is not on DPR but she did give verbal consent via Clinical research associate, that our schedulers can call him to arrange needed appts.  She is also aware that next time she is in the office, she will need to sign a DPR form, so that we have official documentation in her chart, stating we can call her husband with her medical information.  Informed the pt that I will endorse to our schedulers to call her Husband to arrange appts.  While I had the pt on the phone, I went ahead and scheduled her follow-up appointment with Dr. Shari Prows for 05/12/21 at 0920, for she requested August, for she will be back in the country at that time.  She is aware to arrive 15 mins prior to that appt.  Pt verbalized understanding and agrees with this plan, via Clinical research associate with [employer].

## 2021-02-05 NOTE — Telephone Encounter (Signed)
Loa Socks, LPN  03/11/1244 8:09 AM EDT Back to Top     Left message for the pt to call back for results, via Arabic Interpreter (with [employer]) [place] [id number]. Per [place], pt has another contact number we tried to reach her at, per her Husband. It is [phone]. Both contact numbers were called and pt did not answer, so Assam left her a message to call the office back to receive results.

## 2021-02-11 ENCOUNTER — Other Ambulatory Visit: Payer: 59 | Admitting: *Deleted

## 2021-02-11 ENCOUNTER — Ambulatory Visit (HOSPITAL_COMMUNITY): Payer: 59 | Attending: Cardiology

## 2021-02-11 ENCOUNTER — Other Ambulatory Visit: Payer: Self-pay

## 2021-02-11 DIAGNOSIS — R9431 Abnormal electrocardiogram [ECG] [EKG]: Secondary | ICD-10-CM | POA: Diagnosis present

## 2021-02-11 DIAGNOSIS — R002 Palpitations: Secondary | ICD-10-CM

## 2021-02-11 DIAGNOSIS — I493 Ventricular premature depolarization: Secondary | ICD-10-CM | POA: Insufficient documentation

## 2021-02-11 DIAGNOSIS — R079 Chest pain, unspecified: Secondary | ICD-10-CM | POA: Insufficient documentation

## 2021-02-11 LAB — ECHOCARDIOGRAM COMPLETE
Area-P 1/2: 3.34 cm2
S' Lateral: 2.9 cm

## 2021-02-12 LAB — BASIC METABOLIC PANEL
BUN/Creatinine Ratio: 18 (ref 9–23)
BUN: 13 mg/dL (ref 6–20)
CO2: 23 mmol/L (ref 20–29)
Calcium: 9.8 mg/dL (ref 8.7–10.2)
Chloride: 104 mmol/L (ref 96–106)
Creatinine, Ser: 0.71 mg/dL (ref 0.57–1.00)
Glucose: 98 mg/dL (ref 65–99)
Potassium: 4.6 mmol/L (ref 3.5–5.2)
Sodium: 141 mmol/L (ref 134–144)
eGFR: 113 mL/min/{1.73_m2} (ref >59)

## 2021-02-12 LAB — TSH: TSH: 2.36 u[IU]/mL (ref 0.450–4.500)

## 2021-02-12 LAB — MAGNESIUM: Magnesium: 2.3 mg/dL (ref 1.6–2.3)

## 2021-02-21 ENCOUNTER — Ambulatory Visit
Admission: RE | Admit: 2021-02-21 | Discharge: 2021-02-21 | Disposition: A | Discharge status: Home / Self Care | Payer: 59 | Source: Ambulatory Visit | Attending: Neurology | Admitting: Neurology

## 2021-02-21 DIAGNOSIS — R42 Dizziness and giddiness: Secondary | ICD-10-CM

## 2021-02-21 DIAGNOSIS — R519 Headache, unspecified: Secondary | ICD-10-CM

## 2021-02-21 MED ORDER — GADOBENATE DIMEGLUMINE 529 MG/ML IV SOLN
15.0000 mL | Freq: Once | INTRAVENOUS | Status: AC | PRN
  Administered 2021-02-21: 15 mL via INTRAVENOUS

## 2021-02-26 ENCOUNTER — Telehealth: Payer: Self-pay | Admitting: Neurology

## 2021-02-26 NOTE — Progress Notes (Signed)
Pt advised of Mri of the normal Via Interpreter.  Pt wanted to know what the next steps. Per ov note pt to f/u after testing. Should she schedule a visit. Pt will be traveling aboard 6/4-8/5 22.

## 2021-02-26 NOTE — Telephone Encounter (Signed)
Patient called and requested a call back about her MRI results.

## 2021-02-26 NOTE — Telephone Encounter (Signed)
See result note.  

## 2021-03-07 ENCOUNTER — Other Ambulatory Visit: Payer: Self-pay

## 2021-03-07 MED ORDER — NORTRIPTYLINE HCL 10 MG PO CAPS: 10.0000 mg | ORAL_CAPSULE | Freq: Every day | ORAL | 3 refills | Status: AC

## 2021-04-17 ENCOUNTER — Telehealth: Payer: Self-pay | Admitting: Cardiology

## 2021-04-17 NOTE — Telephone Encounter (Signed)
Pt is requesting a Provider Switch from Dr. Shari Prows to Dr. Dietrich Pates

## 2021-04-18 NOTE — Telephone Encounter (Signed)
Will route this messaged back to scheduling to arrange pts follow-up appt with Dr. Tenny Craw, being MD switch was okayed.

## 2021-05-12 ENCOUNTER — Ambulatory Visit: Payer: 59 | Admitting: Cardiology

## 2021-06-15 NOTE — Progress Notes (Signed)
Office Visit    Patient Name: Darbie Biancardi Date of Encounter: 06/16/2021  PCP:  Sherral Hammers, FNP   [facility]  Cardiologist:  Dietrich Pates, MD  Advanced Practice Provider:  No care team member to display Electrophysiologist:  None      Chief Complaint    Marlyn Tondreau is a 35 y.o. female with a hx of HTN, HLD, palpitations, chest pain presents today for follow up after monitor   Past Medical History    Past Medical History:  Diagnosis Date   Chest pain    Dizziness    Headache    Hyperlipidemia    Hypertension    No past surgical history on file.  Allergies  No Known Allergies  History of Present Illness    Lacara Dunsworth is a 36 y.o. female with a hx of HTN, HLD, palpitations, chest pain last seen 12/24/20 by Dr. Shari Prows.  ED visit 10/31/20 with chest pain, headache, palpitations. BP at the time 170s. Troponin negative, CXR no acute findings, EKG NSR with no acute changes. She was given migraine cocktail with improvement in symptoms.   Seen by Dr. Shari Prows 12/24/20 with no recurrent chest pain though did not palpitations with associated lightheadedness. Monitor showed predominantly NSR With occasional PAC, PVC, premature junctional beats. Subsequent echo 02/11/21 with normal LVEF and no significant valvular disease. Lab work with normal renal function, electrolytes, thyroid function.   She presents today for follow up. Visit assisted by interpretor.  Reports no shortness of breath nor dyspnea on exertion. Reports no chest pain, pressure, or tightness. No edema, orthopnea, PND. Reports occasional palpitations which feel as if her heart is racing. No excessive caffeine use but does endorse not staying well hydrated. We discussed the benign nature of PACs and PVCs. We reviewed her echo and ZIO in detail.   EKGs/Labs/Other Studies Reviewed:   The following studies were reviewed today:  Long term monitor 01/27/21 Predominant rhythm was  normal sinus rhythm with average heart rate 71bpm and ranged from 47 to 128bpm. Occsaional PACs, PVCs and premature junctional beats.  Echo 02/11/21   1. Left ventricular ejection fraction, by estimation, is 65 to 70%. The  left ventricle has normal function. The left ventricle has no regional  wall motion abnormalities. Left ventricular diastolic parameters were  normal.   2. Right ventricular systolic function is normal. The right ventricular  size is normal. Tricuspid regurgitation signal is inadequate for assessing  PA pressure.   3. The mitral valve is normal in structure. No evidence of mitral valve  regurgitation. No evidence of mitral stenosis.   4. The aortic valve was not well visualized. Aortic valve regurgitation  is not visualized. No aortic stenosis is present.   5. The inferior vena cava is normal in size with greater than 50%  respiratory variability, suggesting right atrial pressure of 3 mmHg.   EKG:  EKG is ordered today.  The ekg ordered today demonstrates NSR 60 bpm with sinus arrhythmia and no acute ST/T wave changes.   Recent Labs: 10/31/2020: Hemoglobin 13.8; Platelets 279 02/11/2021: BUN 13; Creatinine, Ser 0.71; Magnesium 2.3; Potassium 4.6; Sodium 141; TSH 2.360  Recent Lipid Panel No results found for: CHOL, TRIG, HDL, CHOLHDL, VLDL, LDLCALC, LDLDIRECT  Home Medications   Current Meds  Medication Sig   butalbital-acetaminophen-caffeine (FIORICET) 50-325-40 MG tablet Take 1 tablet by mouth every 6 (six) hours as needed for headache.   cetirizine (ZYRTEC) 10 MG tablet Take 10 mg by  mouth daily.   irbesartan (AVAPRO) 150 MG tablet Take 1 tablet (150 mg total) by mouth daily.   nortriptyline (PAMELOR) 10 MG capsule Take 1 capsule (10 mg total) by mouth at bedtime.     Review of Systems      All other systems reviewed and are otherwise negative except as noted above.  Physical Exam    VS:  BP 118/76   Pulse 60   Ht 5\' 3"  (1.6 m)   Wt 207 lb 9.6 oz  (94.2 kg)   SpO2 98%   BMI 36.77 kg/m  , BMI Body mass index is 36.77 kg/m.  Wt Readings from Last 3 Encounters:  06/16/21 207 lb 9.6 oz (94.2 kg)  02/03/21 209 lb 12.8 oz (95.2 kg)  12/24/20 209 lb 6.4 oz (95 kg)    GEN: Well nourished, well developed, in no acute distress. HEENT: normal. Neck: Supple, no JVD, carotid bruits, or masses. Cardiac: RRR, no murmurs, rubs, or gallops. No clubbing, cyanosis, edema.  Radials/PT 2+ and equal bilaterally.  Respiratory:  Respirations regular and unlabored, clear to auscultation bilaterally. GI: Soft, nontender, nondistended. MS: No deformity or atrophy. Skin: Warm and dry, no rash. Neuro:  Strength and sensation are intact. Psych: Normal affect.  Assessment & Plan    Palpitations - EKG today NSR with sinus arrhythmia. Reports only occasional palpitations. ZIO with infrequent (<1%) PVC/PAC. Echo with normal LVEF and no valvular abnormalities. Palpitations are fleeting and self resolve. No indication for medical therapy. Recommend avoidance of caffeine and increased hydration.   HTN - BP well controlled. Continue current antihypertensive regimen.    Disposition: Follow up in 1 year(s) with Dr. 12/26/20 or APP.  Signed, Tenny Craw, NP 06/16/2021, 10:46 AM [facility]

## 2021-06-16 ENCOUNTER — Ambulatory Visit (INDEPENDENT_AMBULATORY_CARE_PROVIDER_SITE_OTHER): Payer: 59 | Admitting: Family

## 2021-06-16 ENCOUNTER — Other Ambulatory Visit: Payer: Self-pay

## 2021-06-16 ENCOUNTER — Encounter (HOSPITAL_BASED_OUTPATIENT_CLINIC_OR_DEPARTMENT_OTHER): Payer: Self-pay | Admitting: Family

## 2021-06-16 VITALS — BP 118/76 | HR 60 | Ht 63.0 in | Wt 207.6 lb

## 2021-06-16 DIAGNOSIS — I10 Essential (primary) hypertension: Secondary | ICD-10-CM | POA: Diagnosis not present

## 2021-06-16 DIAGNOSIS — R002 Palpitations: Secondary | ICD-10-CM

## 2021-06-16 NOTE — Patient Instructions (Addendum)
Medication Instructions:  Continue your current medication.   *If you need a refill on your cardiac medications before your next appointment, please call your pharmacy*  Lab Work: None ordered today.   Testing/Procedures: Your monitor showed an occasional early beat called a PAC or PVC which were occurring less than 1% of the time. These are very common and not dangerous.  Your echocardiogram (ultrasound of your heart) showed normal heart pumping function and normal heart valves. This is a great result!   Follow-Up: At [facility], you and your health needs are our priority.  As part of our continuing mission to provide you with exceptional heart care, we have created designated Provider Care Teams.  These Care Teams include your primary Cardiologist (physician) and Advanced Practice Providers (APPs -  Physician Assistants and Nurse Practitioners) who all work together to provide you with the care you need, when you need it.  We recommend signing up for the patient portal called "MyChart".  Sign up information is provided on this After Visit Summary.  MyChart is used to connect with patients for Virtual Visits (Telemedicine).  Patients are able to view lab/test results, encounter notes, upcoming appointments, etc.  Non-urgent messages can be sent to your provider as well.   To learn more about what you can do with MyChart, go to [email or web address].    Your next appointment:   1 year(s)  The format for your next appointment:   In Person  Provider:   You may see Dietrich Pates, MD or one of the following Advanced Practice Providers on your designated Care Team:   Tereso Newcomer, PA-C [place], [place]  Other Instructions   To prevent palpitations: Make sure you are adequately hydrated.  Avoid and/or limit caffeine containing beverages like soda or tea. Exercise regularly.  Manage stress well. Some over the counter medications can cause palpitations such as Benadryl, AdvilPM,  TylenolPM. Regular Advil or Tylenol do not cause palpitations.  l

## 2021-06-16 NOTE — Addendum Note (Signed)
Addended by: Regis Bill B on: 06/16/2021 05:31 PM   Modules accepted: Orders

## 2022-05-07 ENCOUNTER — Ambulatory Visit (INDEPENDENT_AMBULATORY_CARE_PROVIDER_SITE_OTHER): Payer: Commercial Managed Care - HMO

## 2022-05-07 ENCOUNTER — Encounter: Payer: Self-pay | Admitting: Emergency Medicine

## 2022-05-07 ENCOUNTER — Ambulatory Visit
Admission: EM | Admit: 2022-05-07 | Discharge: 2022-05-07 | Disposition: A | Discharge status: Home / Self Care | Payer: Commercial Managed Care - HMO | Attending: Internal Medicine | Admitting: Internal Medicine

## 2022-05-07 DIAGNOSIS — R519 Headache, unspecified: Secondary | ICD-10-CM

## 2022-05-07 DIAGNOSIS — R2243 Localized swelling, mass and lump, lower limb, bilateral: Secondary | ICD-10-CM | POA: Diagnosis not present

## 2022-05-07 DIAGNOSIS — R0789 Other chest pain: Secondary | ICD-10-CM | POA: Diagnosis not present

## 2022-05-07 DIAGNOSIS — R079 Chest pain, unspecified: Secondary | ICD-10-CM

## 2022-05-07 LAB — POCT URINALYSIS DIP (MANUAL ENTRY)
Bilirubin, UA: NEGATIVE
Blood, UA: NEGATIVE
Glucose, UA: NEGATIVE mg/dL
Ketones, POC UA: NEGATIVE mg/dL
Leukocytes, UA: NEGATIVE
Nitrite, UA: NEGATIVE
Protein Ur, POC: NEGATIVE mg/dL
Spec Grav, UA: >=1.03 — AB (ref 1.010–1.025)
Urobilinogen, UA: 0.2 E.U./dL
pH, UA: 5 (ref 5.0–8.0)

## 2022-05-07 NOTE — ED Triage Notes (Signed)
Pt is present today with bilateral leg swelling in her legs. Pt sx started x3 days.

## 2022-05-07 NOTE — ED Provider Notes (Addendum)
[facility]    CSN: [id number] Arrival date & time: 05/07/22  1059      History   Chief Complaint Chief Complaint  Patient presents with   Leg Swelling   Appointment    HPI Breanna Simpson is a 36 y.o. female.   Patient presents with bilateral leg swelling, chest pain, headache, dizziness that started about 3 days ago.  Patient has been seen at ED, by cardiology, by neurology for chest pain and headache.  Although, she reports that she has never experienced leg swelling.  Chest pain is present in the center of the chest and is described as a "poking" sensation.  Headache is present at the center of the head.  Dizziness is intermittent.  Denies any associated shortness of breath, blurred vision, nausea, vomiting.  Patient was seen at ED in January 2022 for chest pain and headache with unremarkable work-up.  She was then seen by cardiology multiple times with last being in September 2022.  She had unremarkable cardiac monitor as well as unremarkable echocardiogram completed.  She was seen by neurology and had MRI completed.  She was prescribed nortriptyline but patient reports that she does not remember seeing neurology and only takes blood pressure medication currently.  She has taken Tylenol currently for her headache with minimal improvement.     Past Medical History:  Diagnosis Date   Chest pain    Dizziness    Headache    Hyperlipidemia    Hypertension     There are no problems to display for this patient.   History reviewed. No pertinent surgical history.  OB History   No obstetric history on file.      Home Medications    Prior to Admission medications   Medication Sig Start Date End Date Taking? Authorizing Provider  cetirizine (ZYRTEC) 10 MG tablet Take 10 mg by mouth daily. 12/13/20   [provider]  irbesartan (AVAPRO) 150 MG tablet Take 1 tablet (150 mg total) by mouth daily. 10/31/20 06/16/21  Liberty Handy, PA-C  nortriptyline  (PAMELOR) 10 MG capsule Take 1 capsule (10 mg total) by mouth at bedtime. 03/07/21   Drema Dallas, DO    Family History Family History  Problem Relation Age of Onset   Hypertension Mother    Cancer - Colon Father     Social History Social History   Tobacco Use   Smoking status: Never   Smokeless tobacco: Never  Substance Use Topics   Alcohol use: Never   Drug use: Never     Allergies   Patient has no known allergies.   Review of Systems Review of Systems Per HPI  Physical Exam Triage Vital Signs ED Triage Vitals  Enc Vitals Group     BP 05/07/22 1112 134/83     Pulse Rate 05/07/22 1112 71     Resp 05/07/22 1112 18     Temp 05/07/22 1112 98.1 F (36.7 C)     Temp src --      SpO2 05/07/22 1112 98 %     Weight --      Height --      Head Circumference --      Peak Flow --      Pain Score 05/07/22 1111 0     Pain Loc --      Pain Edu? --      Excl. in GC? --    No data found.  Updated Vital Signs BP 134/83  Pulse 71   Temp 98.1 F (36.7 C)   Resp 18   SpO2 98%   Visual Acuity Right Eye Distance:   Left Eye Distance:   Bilateral Distance:    Right Eye Near:   Left Eye Near:    Bilateral Near:     Physical Exam Constitutional:      General: She is not in acute distress.    Appearance: Normal appearance. She is not toxic-appearing or diaphoretic.  HENT:     Head: Normocephalic and atraumatic.  Eyes:     Extraocular Movements: Extraocular movements intact.     Conjunctiva/sclera: Conjunctivae normal.     Pupils: Pupils are equal, round, and reactive to light.  Cardiovascular:     Rate and Rhythm: Normal rate and regular rhythm.     Pulses: Normal pulses.     Heart sounds: Normal heart sounds.  Pulmonary:     Effort: Pulmonary effort is normal. No respiratory distress.     Breath sounds: Normal breath sounds.  Skin:    Comments: Nonpitting edema present to bilateral lateral malleolus that extends slightly to dorsal surface of foot.  No  discoloration, warmth, abrasions, lacerations noted. Patient has full ROM of feet. Capillary refill and pulses normal. No tenderness.   Neurological:     General: No focal deficit present.     Mental Status: She is alert and oriented to person, place, and time. Mental status is at baseline.     Cranial Nerves: Cranial nerves 2-12 are intact.     Sensory: Sensation is intact.     Motor: Motor function is intact.     Coordination: Coordination is intact.     Gait: Gait is intact.  Psychiatric:        Mood and Affect: Mood normal.        Behavior: Behavior normal.        Thought Content: Thought content normal.        Judgment: Judgment normal.      UC Treatments / Results  Labs (all labs ordered are listed, but only abnormal results are displayed) Labs Reviewed  POCT URINALYSIS DIP (MANUAL ENTRY) - Abnormal; Notable for the following components:      Result Value   Spec Grav, UA >=1.030 (*)    All other components within normal limits  COMPREHENSIVE METABOLIC PANEL  CBC  TSH    EKG   Radiology DG Chest 2 View  Result Date: 05/07/2022 CLINICAL DATA:  Chest pain EXAM: CHEST - 2 VIEW COMPARISON:  10/31/2020 FINDINGS: Cardiac and mediastinal contours are within normal limits. No focal pulmonary opacity. No pleural effusion or pneumothorax. No acute osseous abnormality. IMPRESSION: No acute cardiopulmonary process. Electronically Signed   By: Wiliam Ke M.D.   On: 05/07/2022 12:15    Procedures Procedures (including critical care time)  Medications Ordered in UC Medications - No data to display  Initial Impression / Assessment and Plan / UC Course  I have reviewed the triage vital signs and the nursing notes.  Pertinent labs & imaging results that were available during my care of the patient were reviewed by me and considered in my medical decision making (see chart for details).     Given new onset leg swelling with associated chest pain and headache, patient was  advised to go to the ER for further evaluation and management.  Patient adamantly refused to go to the hospital.  Risks associated with not going to hospital discussed with patient.  Patient voiced  understanding and still wished to not be seen in ER.  Given that patient declined going to the ER, will perform a very limited work-up here in urgent care.  Patient was advised of limitations of work-up here in urgent care.  Patient voiced understanding.  EKG was normal sinus rhythm and unremarkable.  Chest x-ray was negative for any acute cardiopulmonary process.  CMP, CBC, TSH are pending.  Unable to complete BNP given limited resources here in urgent care.  Urinalysis was also unremarkable.  Advised patient to elevate extremities, limit water intake, limit salt intake, and discussed compression stockings.  Will defer Lasix at this time given that stat blood work cannot be completed and do not have kidney function numbers available.  Advised to go the ER if symptoms persist or worsen.  Advised patient to follow-up with established cardiology for further evaluation and management given new onset of symptoms.  Patient voiced understanding.  Interpreter used throughout patient interaction. Final Clinical Impressions(s) / UC Diagnoses   Final diagnoses:  Other chest pain  Acute intractable headache, unspecified headache type  Localized swelling, mass, or lump of lower extremity, bilateral     Discharge Instructions      Chest x-ray was normal.  Your blood work is pending.  We will call if there are any abnormalities.  I recommend that you call your cardiologist to make an appointment for further evaluation and management.  Please go to the ER if symptoms persist or worsen.     ED Prescriptions   None    PDMP not reviewed this encounter.   Gustavus Bryant, [place] 05/07/22 1254    Gustavus Bryant, [place] 05/07/22 1255

## 2022-05-07 NOTE — Discharge Instructions (Signed)
Chest x-ray was normal.  Your blood work is pending.  We will call if there are any abnormalities.  I recommend that you call your cardiologist to make an appointment for further evaluation and management.  Please go to the ER if symptoms persist or worsen.

## 2022-05-08 LAB — COMPREHENSIVE METABOLIC PANEL
ALT: 17 IU/L (ref 0–32)
AST: 14 IU/L (ref 0–40)
Albumin/Globulin Ratio: 2 (ref 1.2–2.2)
Albumin: 4.5 g/dL (ref 3.9–4.9)
Alkaline Phosphatase: 67 IU/L (ref 44–121)
BUN/Creatinine Ratio: 19 (ref 9–23)
BUN: 12 mg/dL (ref 6–20)
Bilirubin Total: 0.2 mg/dL (ref 0.0–1.2)
CO2: 21 mmol/L (ref 20–29)
Calcium: 8.4 mg/dL — ABNORMAL LOW (ref 8.7–10.2)
Chloride: 104 mmol/L (ref 96–106)
Creatinine, Ser: 0.62 mg/dL (ref 0.57–1.00)
Globulin, Total: 2.3 g/dL (ref 1.5–4.5)
Glucose: 93 mg/dL (ref 70–99)
Potassium: 4.6 mmol/L (ref 3.5–5.2)
Sodium: 137 mmol/L (ref 134–144)
Total Protein: 6.8 g/dL (ref 6.0–8.5)
eGFR: 118 mL/min/{1.73_m2} (ref >59)

## 2022-05-08 LAB — CBC
Hematocrit: 41.2 % (ref 34.0–46.6)
Hemoglobin: 13.6 g/dL (ref 11.1–15.9)
MCH: 29 pg (ref 26.6–33.0)
MCHC: 33 g/dL (ref 31.5–35.7)
MCV: 88 fL (ref 79–97)
Platelets: 277 10*3/uL (ref 150–450)
RBC: 4.69 x10E6/uL (ref 3.77–5.28)
RDW: 12.1 % (ref 11.7–15.4)
WBC: 6.4 10*3/uL (ref 3.4–10.8)

## 2022-05-08 LAB — TSH: TSH: 2.01 u[IU]/mL (ref 0.450–4.500)

## 2022-05-13 ENCOUNTER — Telehealth (HOSPITAL_COMMUNITY): Payer: Self-pay | Admitting: Emergency Medicine

## 2022-05-13 NOTE — Telephone Encounter (Signed)
Per Rolly Salter, APP "Calcium is mildly low which should not be cause for concern but just make sure she is following up with PCP as well for recheck. "

## 2022-05-15 NOTE — Telephone Encounter (Signed)
Attempted to reach patient x 1 using an arabic interpreter, LVM

## 2022-05-18 NOTE — Telephone Encounter (Signed)
Everything reviewed with patient using an arabic interpreter, all questions answered

## 2022-07-02 DIAGNOSIS — I83893 Varicose veins of bilateral lower extremities with other complications: Secondary | ICD-10-CM | POA: Insufficient documentation

## 2023-05-19 IMAGING — MR MR HEAD WO/W CM
12 series · 48 of 48 positions shown · IV contrast (multihance)
Comparison: None.

CLINICAL DATA: Headache, dizziness

EXAM:
MRI HEAD WITHOUT AND WITH CONTRAST
TECHNIQUE: Multiplanar, multiecho pulse sequences of the brain and surrounding
structures were obtained without and with intravenous contrast.
CONTRAST:  15mL MULTIHANCE GADOBENATE DIMEGLUMINE 529 MG/ML IV SOLN

[Series 2: T1 · sagittal · 5.0mm · 0.45mm/px · 1 of 22 slices shown]
[im 1/22]
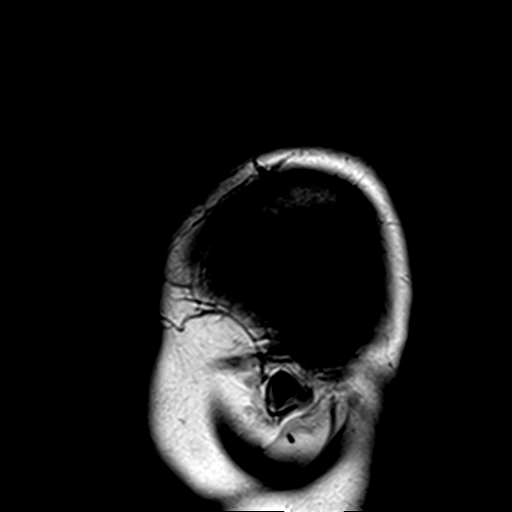

[Series 3: DWI · axial · 3.0mm · 1.80mm/px · z∈[-53,+93]mm · 7 of 100 slices shown (1 of 4)]
[im 1/100]
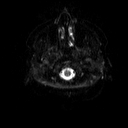
[im 17/100]
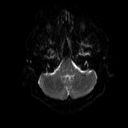
[im 34/100]
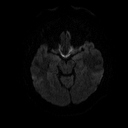
[im 50/100]
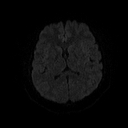
[im 67/100]
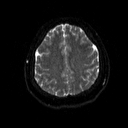
[im 83/100]
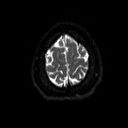
[im 100/100]
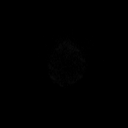

[Series 4: DWI · axial · 3.0mm · 1.80mm/px · z∈[-53,+93]mm · 3 of 50 slices shown (2 of 4)]
[im 1/50]
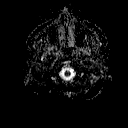
[im 25/50]
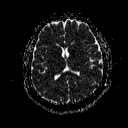
[im 50/50]
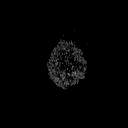

[Series 5: DWI · coronal · 5.0mm · 1.80mm/px · 5 of 67 slices shown (3 of 4)]
[im 1/67]
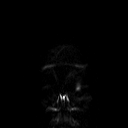
[im 17/67]
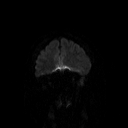
[im 34/67]
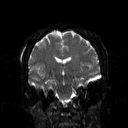
[im 50/67]
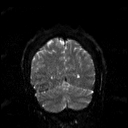
[im 67/67]
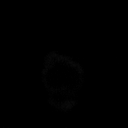

[Series 6: DWI · coronal · 5.0mm · 1.80mm/px · 2 of 34 slices shown (4 of 4)]
[im 1/34]
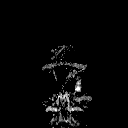
[im 34/34]
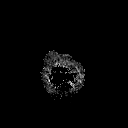

[Series 7: T2 · axial · 5.0mm · 0.60mm/px · z∈[-49,+92]mm · 2 of 22 slices shown (1 of 2)]
[im 1/22]
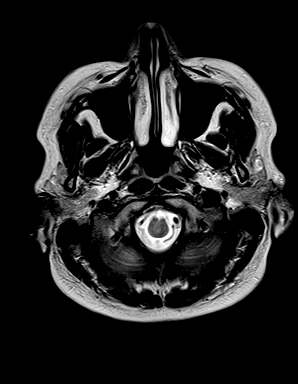
[im 22/22]
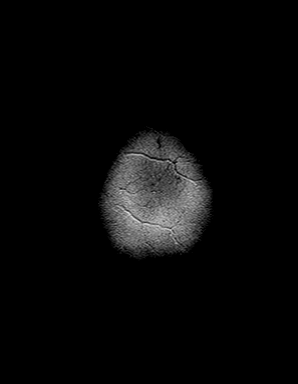

[Series 8: FLAIR · axial · 3.0mm · 0.45mm/px · z∈[-47,+87]mm · 2 of 30 slices shown]
[im 1/30]
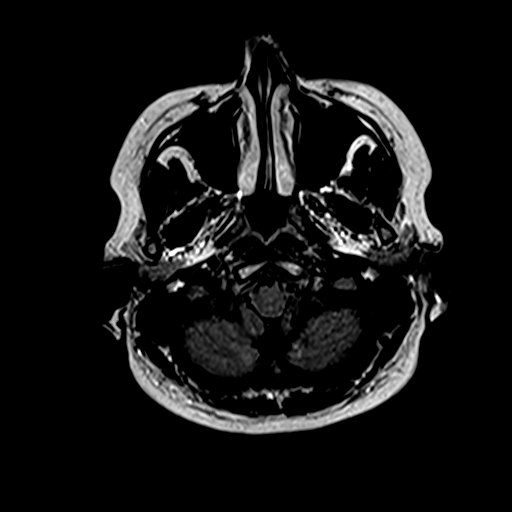
[im 30/30]
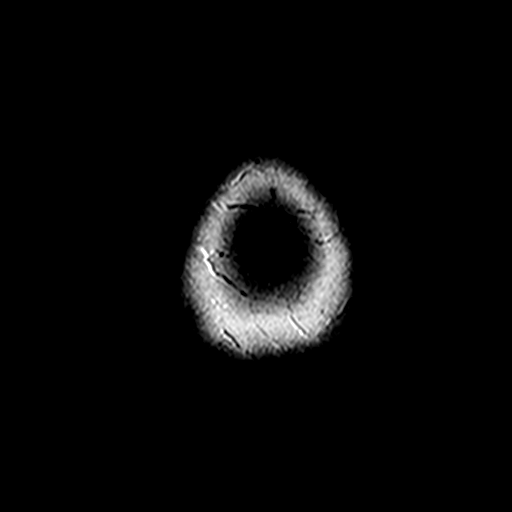

[Series 10: swi_images · axial · 4.0mm · 0.90mm/px · z∈[-49,+90]mm · 2 of 36 slices shown]
[im 1/36]
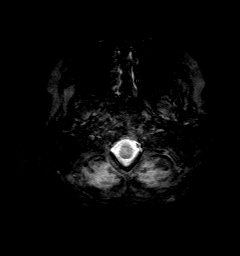
[im 36/36]
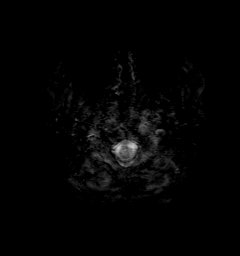

[Series 11: t1_mpr_tra · axial · 1.0mm · 0.75mm/px · z∈[-59,+84]mm · 10 of 144 slices shown]
[im 1/144]
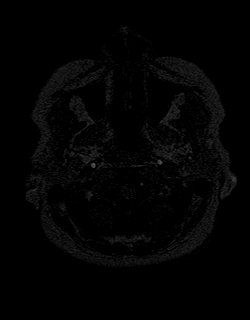
[im 16/144]
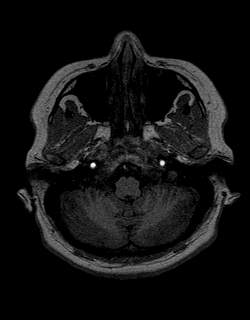
[im 32/144]
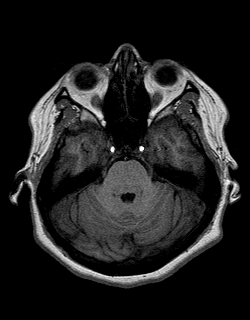
[im 48/144]
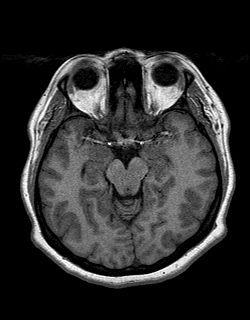
[im 64/144]
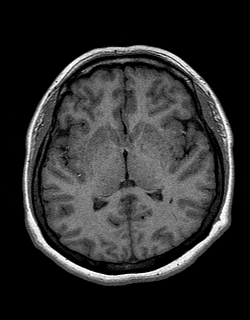
[im 80/144]
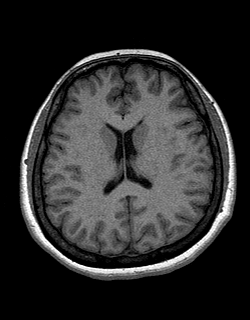
[im 96/144]
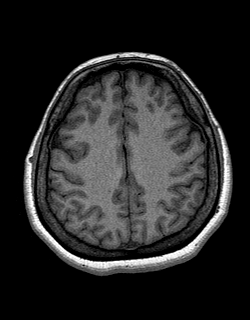
[im 112/144]
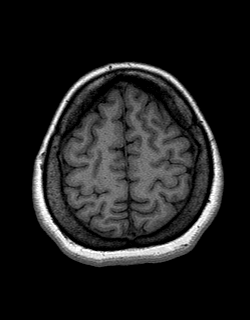
[im 128/144]
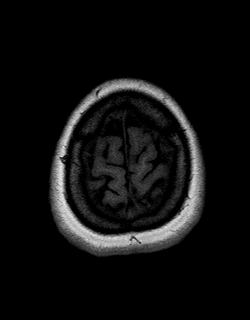
[im 144/144]
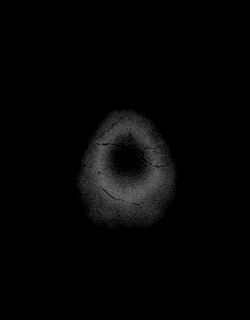

[Series 12: T2 · coronal · 5.0mm · 0.45mm/px · 2 of 25 slices shown (2 of 2)]
[im 1/25]
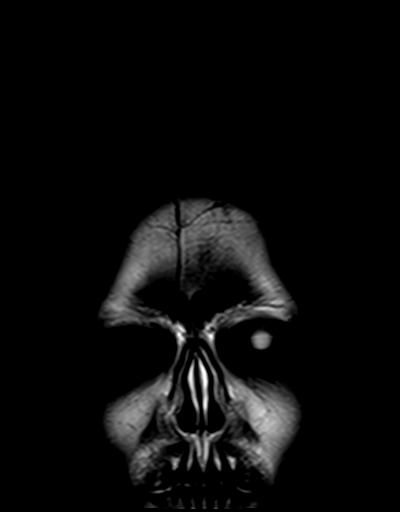
[im 25/25]
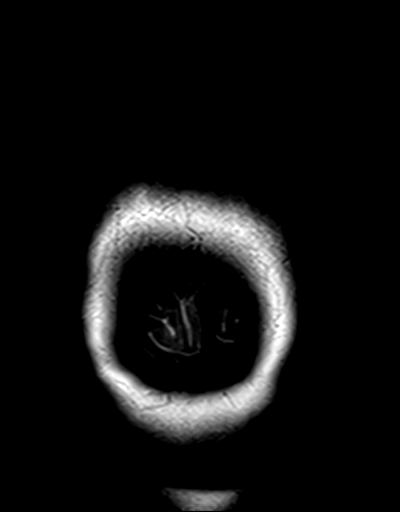

[Series 13: t1_mpr_tra post · axial · 1.0mm · 0.75mm/px · z∈[-59,+84]mm · 10 of 144 slices shown]
[im 1/144]
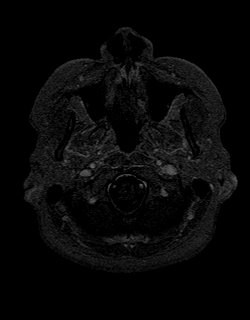
[im 16/144]
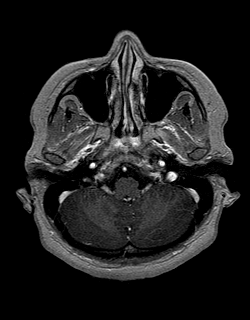
[im 32/144]
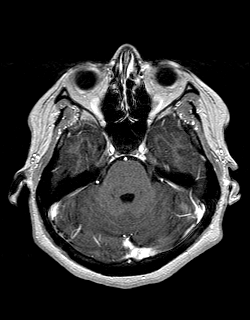
[im 48/144]
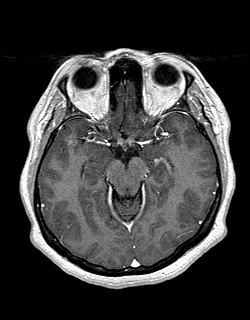
[im 64/144]
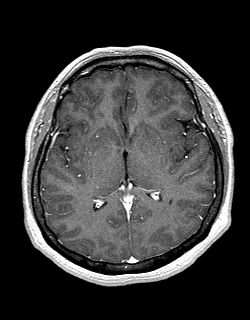
[im 80/144]
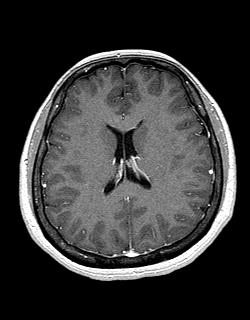
[im 96/144]
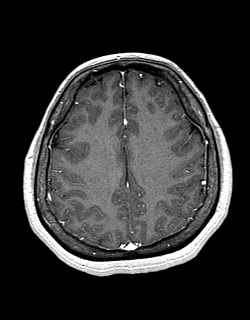
[im 112/144]
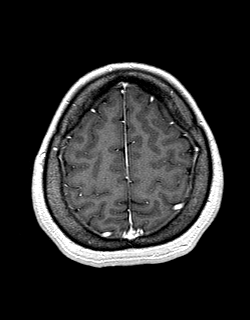
[im 128/144]
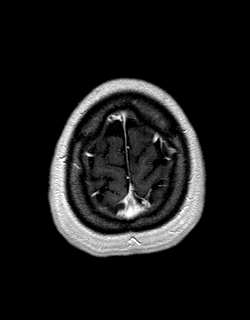
[im 144/144]
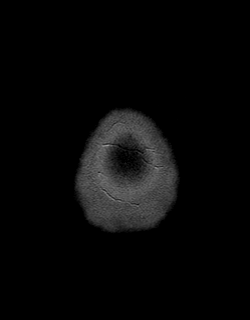

[Series 14: post cor · coronal · 5.0mm · 0.45mm/px · 2 of 25 slices shown]
[im 1/25]
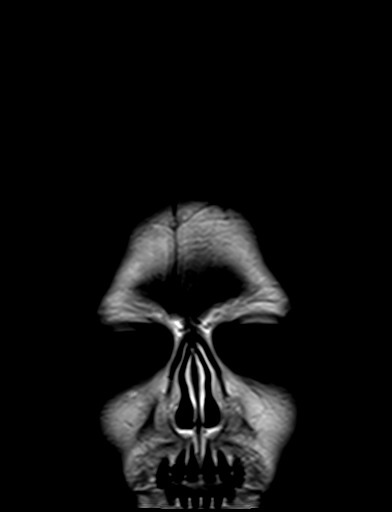
[im 25/25]
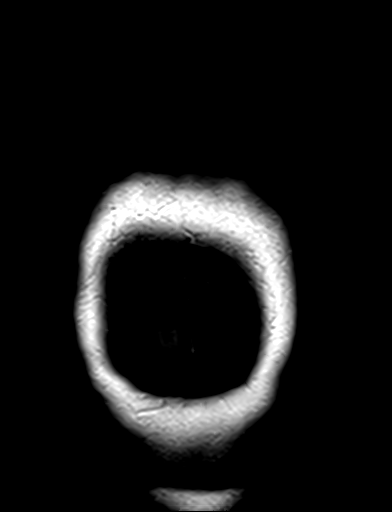

[48 of 48 positions shown; findings below may reference images not displayed]

FINDINGS: Brain: No acute infarction, hemorrhage, hydrocephalus, extra-axial
collection or mass lesion. Normal cerebral white matter. Normal
enhancement following contrast infusion.

Vascular: Normal arterial flow voids.  Normal venous enhancement

Skull and upper cervical spine: Negative

Sinuses/Orbits: Negative

Other: None
IMPRESSION: Normal MRI head with contrast.  Acute chronic

## 2023-08-12 DIAGNOSIS — K581 Irritable bowel syndrome with constipation: Secondary | ICD-10-CM | POA: Insufficient documentation

## 2023-08-12 DIAGNOSIS — K219 Gastro-esophageal reflux disease without esophagitis: Secondary | ICD-10-CM | POA: Insufficient documentation

## 2023-09-11 ENCOUNTER — Ambulatory Visit
Admission: EM | Admit: 2023-09-11 | Discharge: 2023-09-11 | Disposition: A | Discharge status: Home / Self Care | Payer: BLUE CROSS/BLUE SHIELD

## 2023-09-11 DIAGNOSIS — H65191 Other acute nonsuppurative otitis media, right ear: Secondary | ICD-10-CM | POA: Diagnosis not present

## 2023-09-11 MED ORDER — AMOXICILLIN 500 MG PO CAPS: 500.0000 mg | ORAL_CAPSULE | Freq: Three times a day (TID) | ORAL | 0 refills | Status: DC

## 2023-09-11 NOTE — ED Triage Notes (Signed)
Due to language barrier, an interpreter was present during the history-taking and subsequent discussion (and for part of the physical exam) with this patient. Breanna Simpson. Number: [id number].  "For more than a week I have been having pulsating pain that starts in the right ear moves down the neck and to the back of my head". No injury known. No fever.

## 2023-09-15 NOTE — ED Provider Notes (Signed)
[facility]    CSN: [id number] Arrival date & time: 09/11/23  1340      History   Chief Complaint Chief Complaint  Patient presents with   Pain    HPI Breanna Simpson is a 37 y.o. female.   Patient here today for evaluation of pain that starts in her right ear and moves down her neck to the back of her head.  She has not had any injury.  She denies any fever.  She does not report any congestion or cough.  The history is provided by the patient.    Past Medical History:  Diagnosis Date   Chest pain    Dizziness    Headache    Hyperlipidemia    Hypertension     Patient Active Problem List   Diagnosis Date Noted   Irritable bowel syndrome with constipation 08/12/2023   Gastroesophageal reflux disease without esophagitis 08/12/2023   Varicose veins of leg with swelling, bilateral 07/02/2022   Hyperlipidemia 11/25/2020   History of vitamin D deficiency 11/25/2020   Headache 11/25/2020   Essential hypertension 06/06/2020    History reviewed. No pertinent surgical history.  OB History   No obstetric history on file.      Home Medications    Prior to Admission medications   Medication Sig Start Date End Date Taking? Authorizing Provider  amoxicillin (AMOXIL) 500 MG capsule Take 1 capsule (500 mg total) by mouth 3 (three) times daily. 09/11/23  Yes Tomi Bamberger, PA-C  ibuprofen (ADVIL) 600 MG tablet Take 600 mg by mouth every 8 (eight) hours as needed. 11/02/22  Yes [provider]  irbesartan (AVAPRO) 150 MG tablet Take by mouth. 04/16/22  Yes [provider]  omeprazole (PRILOSEC) 20 MG capsule Take 20 mg by mouth daily. 06/04/23  Yes [provider]  cetirizine (ZYRTEC) 10 MG tablet Take 10 mg by mouth daily. 12/13/20   [provider]  hydrocortisone 2.5 % cream Apply 1 Application topically 2 (two) times daily.    [provider]  irbesartan (AVAPRO) 150 MG tablet Take 1 tablet (150 mg total) by mouth  daily. 10/31/20 06/16/21  Liberty Handy, PA-C  nortriptyline (PAMELOR) 10 MG capsule Take 1 capsule (10 mg total) by mouth at bedtime. 03/07/21   Everlena Cooper, Adam R, DO  RABEprazole (ACIPHEX) 20 MG tablet Take 20 mg by mouth daily.    [provider]    Family History Family History  Problem Relation Age of Onset   Hypertension Mother    Cancer - Colon Father     Social History Social History   Tobacco Use   Smoking status: Never   Smokeless tobacco: Never  Vaping Use   Vaping status: Never Used  Substance Use Topics   Alcohol use: Never   Drug use: Never     Allergies   Patient has no known allergies.   Review of Systems Review of Systems  Constitutional:  Negative for chills and fever.  HENT:  Positive for ear pain. Negative for congestion and sore throat.   Eyes:  Negative for discharge and redness.  Respiratory:  Negative for cough, shortness of breath and wheezing.   Gastrointestinal:  Negative for abdominal pain, diarrhea, nausea and vomiting.     Physical Exam Triage Vital Signs ED Triage Vitals  Encounter Vitals Group     BP 09/11/23 1416 136/80     Systolic BP Percentile --      Diastolic BP Percentile --  Pulse Rate 09/11/23 1416 73     Resp 09/11/23 1416 18     Temp 09/11/23 1416 98 F (36.7 C)     Temp Source 09/11/23 1416 Oral     SpO2 09/11/23 1416 97 %     Weight 09/11/23 1413 207 lb 10.8 oz (94.2 kg)     Height 09/11/23 1413 5\' 3"  (1.6 m)     Head Circumference --      Peak Flow --      Pain Score 09/11/23 1407 10     Pain Loc --      Pain Education --      Exclude from Growth Chart --    No data found.  Updated Vital Signs BP 136/80 (BP Location: Left Arm)   Pulse 73   Temp 98 F (36.7 C) (Oral)   Resp 18   Ht 5\' 3"  (1.6 m)   Wt 207 lb 10.8 oz (94.2 kg)   LMP 08/27/2023 (Exact Date)   SpO2 97%   BMI 36.79 kg/m   Visual Acuity Right Eye Distance:   Left Eye Distance:   Bilateral Distance:    Right Eye Near:    Left Eye Near:    Bilateral Near:     Physical Exam Vitals and nursing note reviewed.  Constitutional:      General: She is not in acute distress.    Appearance: Normal appearance. She is not ill-appearing.  HENT:     Head: Normocephalic and atraumatic.     Left Ear: Tympanic membrane normal.     Ears:     Comments: Right TM erythematous    Nose: Congestion present.     Mouth/Throat:     Mouth: Mucous membranes are moist.     Pharynx: No oropharyngeal exudate or posterior oropharyngeal erythema.  Eyes:     Conjunctiva/sclera: Conjunctivae normal.  Cardiovascular:     Rate and Rhythm: Normal rate and regular rhythm.     Heart sounds: Normal heart sounds. No murmur heard. Pulmonary:     Effort: Pulmonary effort is normal. No respiratory distress.     Breath sounds: Normal breath sounds. No wheezing, rhonchi or rales.  Skin:    General: Skin is warm and dry.  Neurological:     Mental Status: She is alert.  Psychiatric:        Mood and Affect: Mood normal.        Thought Content: Thought content normal.      UC Treatments / Results  Labs (all labs ordered are listed, but only abnormal results are displayed) Labs Reviewed - No data to display  EKG   Radiology No results found.  Procedures Procedures (including critical care time)  Medications Ordered in UC Medications - No data to display  Initial Impression / Assessment and Plan / UC Course  I have reviewed the triage vital signs and the nursing notes.  Pertinent labs & imaging results that were available during my care of the patient were reviewed by me and considered in my medical decision making (see chart for details).    Will treat to cover otitis media with amoxicillin.  Advise follow-up if no gradual improvement with any further concerns.  Final Clinical Impressions(s) / UC Diagnoses   Final diagnoses:  Other acute nonsuppurative otitis media of right ear, recurrence not specified   Discharge  Instructions   None    ED Prescriptions     Medication Sig Dispense Auth. Provider   amoxicillin (AMOXIL) 500  MG capsule Take 1 capsule (500 mg total) by mouth 3 (three) times daily. 21 capsule Tomi Bamberger, PA-C      PDMP not reviewed this encounter.   Tomi Bamberger, PA-C 09/15/23 1626

## 2024-02-23 ENCOUNTER — Other Ambulatory Visit: Payer: Self-pay

## 2024-02-23 ENCOUNTER — Emergency Department (HOSPITAL_COMMUNITY)

## 2024-02-23 ENCOUNTER — Emergency Department (HOSPITAL_COMMUNITY)
Admission: EM | Admit: 2024-02-23 | Discharge: 2024-02-24 | Disposition: A | Discharge status: Home / Self Care | Attending: Emergency Medicine | Admitting: Emergency Medicine

## 2024-02-23 ENCOUNTER — Encounter (HOSPITAL_COMMUNITY): Payer: Self-pay | Admitting: Emergency Medicine

## 2024-02-23 DIAGNOSIS — M79604 Pain in right leg: Secondary | ICD-10-CM | POA: Diagnosis not present

## 2024-02-23 DIAGNOSIS — R002 Palpitations: Secondary | ICD-10-CM | POA: Diagnosis not present

## 2024-02-23 DIAGNOSIS — R1030 Lower abdominal pain, unspecified: Secondary | ICD-10-CM | POA: Diagnosis not present

## 2024-02-23 DIAGNOSIS — M79605 Pain in left leg: Secondary | ICD-10-CM | POA: Diagnosis not present

## 2024-02-23 DIAGNOSIS — H538 Other visual disturbances: Secondary | ICD-10-CM | POA: Diagnosis present

## 2024-02-23 DIAGNOSIS — I10 Essential (primary) hypertension: Secondary | ICD-10-CM | POA: Diagnosis not present

## 2024-02-23 DIAGNOSIS — Z79899 Other long term (current) drug therapy: Secondary | ICD-10-CM | POA: Diagnosis not present

## 2024-02-23 LAB — CBC WITH DIFFERENTIAL/PLATELET
Abs Immature Granulocytes: 0.02 10*3/uL (ref 0.00–0.07)
Basophils Absolute: 0 10*3/uL (ref 0.0–0.1)
Basophils Relative: 1 %
Eosinophils Absolute: 0.1 10*3/uL (ref 0.0–0.5)
Eosinophils Relative: 2 %
HCT: 39.1 % (ref 36.0–46.0)
Hemoglobin: 13.1 g/dL (ref 12.0–15.0)
Immature Granulocytes: 0 %
Lymphocytes Relative: 44 %
Lymphs Abs: 3.3 10*3/uL (ref 0.7–4.0)
MCH: 29.8 pg (ref 26.0–34.0)
MCHC: 33.5 g/dL (ref 30.0–36.0)
MCV: 89.1 fL (ref 80.0–100.0)
Monocytes Absolute: 0.4 10*3/uL (ref 0.1–1.0)
Monocytes Relative: 6 %
Neutro Abs: 3.7 10*3/uL (ref 1.7–7.7)
Neutrophils Relative %: 47 %
Platelets: 293 10*3/uL (ref 150–400)
RBC: 4.39 MIL/uL (ref 3.87–5.11)
RDW: 12.1 % (ref 11.5–15.5)
WBC: 7.6 10*3/uL (ref 4.0–10.5)
nRBC: 0 % (ref 0.0–0.2)

## 2024-02-23 LAB — MAGNESIUM: Magnesium: 2 mg/dL (ref 1.7–2.4)

## 2024-02-23 LAB — CBG MONITORING, ED: Glucose-Capillary: 107 mg/dL — ABNORMAL HIGH (ref 70–99)

## 2024-02-23 LAB — COMPREHENSIVE METABOLIC PANEL WITH GFR
ALT: 20 U/L (ref 0–44)
AST: 21 U/L (ref 15–41)
Albumin: 3.6 g/dL (ref 3.5–5.0)
Alkaline Phosphatase: 41 U/L (ref 38–126)
Anion gap: 9 (ref 5–15)
BUN: 8 mg/dL (ref 6–20)
CO2: 25 mmol/L (ref 22–32)
Calcium: 9.4 mg/dL (ref 8.9–10.3)
Chloride: 107 mmol/L (ref 98–111)
Creatinine, Ser: 0.65 mg/dL (ref 0.44–1.00)
GFR, Estimated: >60 mL/min (ref >60)
Glucose, Bld: 107 mg/dL — ABNORMAL HIGH (ref 70–99)
Potassium: 3.6 mmol/L (ref 3.5–5.1)
Sodium: 141 mmol/L (ref 135–145)
Total Bilirubin: 0.4 mg/dL (ref 0.0–1.2)
Total Protein: 6.5 g/dL (ref 6.5–8.1)

## 2024-02-23 LAB — URINALYSIS, ROUTINE W REFLEX MICROSCOPIC
Bilirubin Urine: NEGATIVE
Glucose, UA: NEGATIVE mg/dL
Hgb urine dipstick: NEGATIVE
Ketones, ur: NEGATIVE mg/dL
Leukocytes,Ua: NEGATIVE
Nitrite: NEGATIVE
Protein, ur: NEGATIVE mg/dL
Specific Gravity, Urine: 1.014 (ref 1.005–1.030)
pH: 5 (ref 5.0–8.0)

## 2024-02-23 LAB — CK: Total CK: 82 U/L (ref 38–234)

## 2024-02-23 LAB — LIPASE, BLOOD: Lipase: 35 U/L (ref 11–51)

## 2024-02-23 LAB — HCG, SERUM, QUALITATIVE: Preg, Serum: NEGATIVE

## 2024-02-23 NOTE — ED Provider Notes (Signed)
  EMERGENCY DEPARTMENT AT [facility] Provider Note   CSN: [id number] Arrival date & time: 02/23/24  2128     History  Chief Complaint  Patient presents with   Leg Swelling    Breanna Simpson is a 38 y.o. female with past medical history of HLD, IBS, HTN, GERD, palpitations presents emergency department for multiple complaints to include bilateral leg pain, blurry vision, palpitations, lower abdominal pain, abdominal distention  Complains of pain of bilateral legs with associated blurred vision, palpitations that started today at 2015.  She reports that she started with bilateral leg pain then progressed to having blurry visions and palpitations.  Took ibuprofen  for leg pain with some relief. Blood pressure at that time was 190/90.  She normally takes irbesartan  150 mg in the morning but following this blood pressure reading she took an additional 2 doses this evening.  She reports that she has had palpitations intermittently over the past year and has been seen by cardiologist regarding this on 10/29/2023 who recommended echo, holter monitor.  Has not completed these nor has follow-up scheduled.  Denies recent travel or surgery, history of clot, hemoptysis, unilateral leg swelling, nor OCP use, recent falls, thinners.  Also complains of lower abdominal pain, distention, "excessive gas" that started last night.  Last bowel movement was this morning and normal.  LMP 02/09/2024 and normal.  Denies urinary symptoms, STD concerns, vaginal discharge, fevers  The history is limited by a language barrier. A language interpreter was used.       Home Medications Prior to Admission medications   Medication Sig Start Date End Date Taking? Authorizing Provider  amoxicillin  (AMOXIL ) 500 MG capsule Take 1 capsule (500 mg total) by mouth 3 (three) times daily. 09/11/23   Vernestine Gondola, PA-C  cetirizine (ZYRTEC) 10 MG tablet Take 10 mg by mouth daily. 12/13/20   [provider]  hydrocortisone 2.5 % cream Apply 1 Application topically 2 (two) times daily.    [provider]  ibuprofen  (ADVIL ) 600 MG tablet Take 600 mg by mouth every 8 (eight) hours as needed. 11/02/22   [provider]  irbesartan  (AVAPRO ) 150 MG tablet Take 1 tablet (150 mg total) by mouth daily. 10/31/20 06/16/21  Theodora Fish, PA-C  irbesartan  (AVAPRO ) 150 MG tablet Take by mouth. 04/16/22   [provider]  nortriptyline  (PAMELOR ) 10 MG capsule Take 1 capsule (10 mg total) by mouth at bedtime. 03/07/21   Merriam Abbey, DO  omeprazole (PRILOSEC) 20 MG capsule Take 20 mg by mouth daily. 06/04/23   [provider]  RABEprazole (ACIPHEX) 20 MG tablet Take 20 mg by mouth daily.    [provider]      Allergies    Patient has no known allergies.    Review of Systems   Review of Systems  Constitutional:  Negative for chills, fatigue and fever.  Respiratory:  Negative for cough, chest tightness, shortness of breath and wheezing.   Cardiovascular:  Negative for chest pain and palpitations.  Gastrointestinal:  Negative for abdominal pain, constipation, diarrhea, nausea and vomiting.  Neurological:  Negative for dizziness, seizures, weakness, light-headedness, numbness and headaches.    Physical Exam Updated Vital Signs BP 123/78   Pulse (!) 56   Temp 98.3 F (36.8 C) (Oral)   Resp 20   Ht 5\' 3"  (1.6 m)   Wt 102 kg   LMP 02/09/2024 (Exact Date)   SpO2 100%   BMI 39.83 kg/m  Physical  Exam Vitals and nursing note reviewed. Exam conducted with a chaperone present.  Constitutional:      General: She is not in acute distress.    Appearance: Normal appearance.  HENT:     Head: Normocephalic and atraumatic.  Eyes:     General: Lids are normal. Vision grossly intact. No visual field deficit.    Extraocular Movements:     Right eye: Normal extraocular motion and no nystagmus.     Left eye: Normal extraocular motion and no nystagmus.      Conjunctiva/sclera: Conjunctivae normal.  Cardiovascular:     Rate and Rhythm: Normal rate.     Pulses:          Radial pulses are 2+ on the right side and 2+ on the left side.       Dorsalis pedis pulses are 2+ on the right side and 2+ on the left side.  Pulmonary:     Effort: Pulmonary effort is normal. No respiratory distress.     Breath sounds: Normal breath sounds.  Chest:     Chest wall: No tenderness.  Abdominal:     General: Bowel sounds are normal.     Palpations: Abdomen is soft.     Tenderness: There is abdominal tenderness in the right lower quadrant, suprapubic area and left lower quadrant. There is no right CVA tenderness, guarding or rebound.     Comments: Nonsurgical abdomen with no peritoneal signs  Genitourinary:    General: Normal vulva.     Exam position: Supine.     Vagina: Normal. No vaginal discharge, bleeding or prolapsed vaginal walls.     Cervix: Normal. No cervical motion tenderness, discharge, friability, erythema or cervical bleeding.  Musculoskeletal:     Comments: Very mild 1+ pitting edema bilaterally No TTP of BLE. Homan sign negative x2 Upon ambulating, patient reports pain of bilateral feet. No signs of infection nor signs of trauma  Skin:    Capillary Refill: Capillary refill takes less than 2 seconds.     Coloration: Skin is not jaundiced or pale.  Neurological:     Mental Status: She is alert and oriented to person, place, and time. Mental status is at baseline.     GCS: GCS eye subscore is 4. GCS verbal subscore is 5. GCS motor subscore is 6.     Cranial Nerves: No cranial nerve deficit, dysarthria or facial asymmetry.     Sensory: No sensory deficit.     Motor: No weakness, tremor, abnormal muscle tone, seizure activity or pronator drift.     Coordination: Coordination normal. Finger-Nose-Finger Test and Heel to [place] Test normal.     Gait: Gait is intact. Gait normal.     Comments: Follows commands appropriately.  Ambulates without  difficulty   Estate manager/land agent GU exam  ED Results / Procedures / Treatments   Labs (all labs ordered are listed, but only abnormal results are displayed) Labs Reviewed  COMPREHENSIVE METABOLIC PANEL WITH GFR - Abnormal; Notable for the following components:      Result Value   Glucose, Bld 107 (*)    All other components within normal limits  CBG MONITORING, ED - Abnormal; Notable for the following components:   Glucose-Capillary 107 (*)    All other components within normal limits  WET PREP, GENITAL  HCG, SERUM, QUALITATIVE  LIPASE, BLOOD  CBC WITH DIFFERENTIAL/PLATELET  URINALYSIS, ROUTINE W REFLEX MICROSCOPIC  MAGNESIUM  CK  CBC  GC/CHLAMYDIA PROBE AMP ([place]) NOT AT  [facility]  TROPONIN I (HIGH SENSITIVITY)  TROPONIN I (HIGH SENSITIVITY)    EKG None  Radiology DG Chest Portable 1 View Result Date: 02/23/2024 CLINICAL DATA:  Hypertension, malaise EXAM: PORTABLE CHEST 1 VIEW COMPARISON:  None Available. FINDINGS: The heart size and mediastinal contours are within normal limits. Both lungs are clear. The visualized skeletal structures are unremarkable. IMPRESSION: No active disease. Electronically Signed   By: Worthy Heads M.D.   On: 02/23/2024 22:47    Procedures Procedures    Medications Ordered in ED Medications - No data to display  ED Course/ Medical Decision Making/ A&P                                 Medical Decision Making Amount and/or Complexity of Data Reviewed Labs: ordered. Radiology: ordered.   Patient presents to the ED for concern of abd pain, blurred vision, palpitations, leg pain, this involves an extensive number of treatment options, and is a complaint that carries with it a high risk of complications and morbidity.  The differential diagnosis includes intra-abdominal pathology, GYN pathology, ICH, migraine, vasovagal, syncopal episode, fluid overload, DVT, PE   Co morbidities that complicate the patient evaluation  See  HPI   Additional history obtained:  Additional history obtained from Nursing   External records from outside source obtained and reviewed including RN note   Lab Tests:  I Ordered, and personally interpreted labs.  The pertinent results include:   First troponin 3 CBG 107    Cardiac Monitoring:  The patient was maintained on a cardiac monitor.  I personally viewed and interpreted the cardiac monitored which showed an underlying rhythm of: NSR at 65 bpm.  No ST nor T wave abnormalities     Problem List / ED Course:  Bilateral leg pain Very minimal 1+ pitting edema bilaterally.  Low suspicion for fluid overload especially with negative chest x-ray. No signs of infection, erythema, warmth to legs.  No weeping nor drainage from legs.   DP 2+ equally bilaterally and well-perfused legs.  Low suspicion for ischemic limbs Pain improved following ibuprofen  CK WNL Will set up outpatient DVT ultrasound for patient to rule out DVT  Blurred vision Headache Could be related to reported blood pressure of 190/90.  Headache and blurry vision have significantly improved following patient taking 3 doses of her BP meds today No signs of EOD.  Creatinine WNL. Headache is mild rated at 4/10 and gradually worsened following other symptoms prior.  No visual disturbances currently.  No associated photophobia nor sound sensitivity.   Neuro intact Low suspicion for ICH as there is no recent trauma nor thinners.  Low suspicion for [facility] as headache was gradual in onset and is mild in nature therefore do not think that imaging is necessary nor will change treatment. Low suspicion for CVA/TIA.  Triage note notes unsteady gait by EMS however patient and daughter both report that she has had steady gait all day today.  She denies ever having paresthesia, weakness, or dizziness.  She has been neuro intact here in ED  Palpitations Has been having these intermittently for the past year.  He is followed by  cardiology for this.  Was supposed to get Holter monitor and echo however has not gotten this yet. First troponin 3.  Second troponin is pending Chest x-ray negative for new pulmonary pathology If second troponin is negative, patient can follow-up with cardiology regarding palpitations  Lower abd pain TTP of RLQ, suprapubic, LLQ No leukocytosis Denies urinary symptoms, vaginal symptoms, STD concerns but will sent wet prep, std panel Pelvic unremarkable as noted in PE CT imaging pending   Reevaluation:  After the interventions noted above, I reevaluated the patient and found that they have :improved    Dispostion:  Disposition pending CT imaging results. Sign out to [place]. Fortunately, lab work is unremarkable.  Troponin negative.  No leukocytosis.  UA negative for infection.  hCG negative. Will set up appointment for US  tomorrow for DVT   Final Clinical Impression(s) / ED Diagnoses Final diagnoses:  Bilateral leg pain  Blurred vision, bilateral  Palpitations  Lower abdominal pain    Rx / DC Orders ED Discharge Orders     None         Royann Cords, PA 02/24/24 0059    Albertus Hughs, DO 02/24/24 1501

## 2024-02-23 NOTE — ED Triage Notes (Signed)
 BIB [facility] from home. Pt feels like her feet and ankles started swelling, started feeling heavy and thought BP was increasing. Home BP read 190/90, took two irbesartan , EMS reports BP decreasing. Pt still feels unwell. Pt is without cardiac history and denies CP. Unsteady gait, per EMS. 12 lead reported unremarkable.   BP 124/68 HR 74 O2 100% RA CBG 164

## 2024-02-24 ENCOUNTER — Emergency Department (HOSPITAL_COMMUNITY)

## 2024-02-24 ENCOUNTER — Telehealth (HOSPITAL_COMMUNITY): Payer: Self-pay

## 2024-02-24 LAB — WET PREP, GENITAL
Clue Cells Wet Prep HPF POC: NONE SEEN
Sperm: NONE SEEN
Trich, Wet Prep: NONE SEEN
WBC, Wet Prep HPF POC: <10 (ref <10)
Yeast Wet Prep HPF POC: NONE SEEN

## 2024-02-24 LAB — GC/CHLAMYDIA PROBE AMP (~~LOC~~) NOT AT ARMC
Chlamydia: NEGATIVE
Comment: NEGATIVE
Comment: NORMAL
Neisseria Gonorrhea: NEGATIVE

## 2024-02-24 LAB — TROPONIN I (HIGH SENSITIVITY)
Troponin I (High Sensitivity): 3 ng/L (ref <18)
Troponin I (High Sensitivity): 4 ng/L (ref <18)

## 2024-02-24 MED ORDER — IOHEXOL 350 MG/ML SOLN
75.0000 mL | Freq: Once | INTRAVENOUS | Status: AC | PRN
  Administered 2024-02-24: 75 mL via INTRAVENOUS

## 2024-02-24 NOTE — ED Provider Notes (Signed)
 Assumed care at shift change, see prior notes for full H&P.  Briefly, 38 y.o. F here with multitude of complaints-- leg pain, blurred vision, abdominal pain/distention.  Work-up thus far reassuring.  Plan:  wet prep and CT Pending.  Likely discharge.  Results for orders placed or performed during the hospital encounter of 02/23/24  CBG monitoring, ED   Collection Time: 02/23/24 11:10 PM  Result Value Ref Range   Glucose-Capillary 107 (H) 70 - 99 mg/dL  hCG, serum, qualitative   Collection Time: 02/23/24 11:19 PM  Result Value Ref Range   Preg, Serum NEGATIVE NEGATIVE  Comprehensive metabolic panel   Collection Time: 02/23/24 11:19 PM  Result Value Ref Range   Sodium 141 135 - 145 mmol/L   Potassium 3.6 3.5 - 5.1 mmol/L   Chloride 107 98 - 111 mmol/L   CO2 25 22 - 32 mmol/L   Glucose, Bld 107 (H) 70 - 99 mg/dL   BUN 8 6 - 20 mg/dL   Creatinine, Ser 7.82 0.44 - 1.00 mg/dL   Calcium 9.4 8.9 - 95.6 mg/dL   Total Protein 6.5 6.5 - 8.1 g/dL   Albumin 3.6 3.5 - 5.0 g/dL   AST 21 15 - 41 U/L   ALT 20 0 - 44 U/L   Alkaline Phosphatase 41 38 - 126 U/L   Total Bilirubin 0.4 0.0 - 1.2 mg/dL   GFR, Estimated >21 >30 mL/min   Anion gap 9 5 - 15  Lipase, blood   Collection Time: 02/23/24 11:19 PM  Result Value Ref Range   Lipase 35 11 - 51 U/L  CBC with Diff   Collection Time: 02/23/24 11:19 PM  Result Value Ref Range   WBC 7.6 4.0 - 10.5 K/uL   RBC 4.39 3.87 - 5.11 MIL/uL   Hemoglobin 13.1 12.0 - 15.0 g/dL   HCT 86.5 78.4 - 69.6 %   MCV 89.1 80.0 - 100.0 fL   MCH 29.8 26.0 - 34.0 pg   MCHC 33.5 30.0 - 36.0 g/dL   RDW 29.5 28.4 - 13.2 %   Platelets 293 150 - 400 K/uL   nRBC 0.0 0.0 - 0.2 %   Neutrophils Relative % 47 %   Neutro Abs 3.7 1.7 - 7.7 K/uL   Lymphocytes Relative 44 %   Lymphs Abs 3.3 0.7 - 4.0 K/uL   Monocytes Relative 6 %   Monocytes Absolute 0.4 0.1 - 1.0 K/uL   Eosinophils Relative 2 %   Eosinophils Absolute 0.1 0.0 - 0.5 K/uL   Basophils Relative 1 %    Basophils Absolute 0.0 0.0 - 0.1 K/uL   Immature Granulocytes 0 %   Abs Immature Granulocytes 0.02 0.00 - 0.07 K/uL  Urinalysis, Routine w reflex microscopic -Urine, Clean Catch   Collection Time: 02/23/24 11:19 PM  Result Value Ref Range   Color, Urine YELLOW YELLOW   APPearance CLEAR CLEAR   Specific Gravity, Urine 1.014 1.005 - 1.030   pH 5.0 5.0 - 8.0   Glucose, UA NEGATIVE NEGATIVE mg/dL   Hgb urine dipstick NEGATIVE NEGATIVE   Bilirubin Urine NEGATIVE NEGATIVE   Ketones, ur NEGATIVE NEGATIVE mg/dL   Protein, ur NEGATIVE NEGATIVE mg/dL   Nitrite NEGATIVE NEGATIVE   Leukocytes,Ua NEGATIVE NEGATIVE  Magnesium   Collection Time: 02/23/24 11:19 PM  Result Value Ref Range   Magnesium 2.0 1.7 - 2.4 mg/dL  CK   Collection Time: 02/23/24 11:19 PM  Result Value Ref Range   Total CK 82 38 - 234  U/L  Troponin I (High Sensitivity)   Collection Time: 02/23/24 11:19 PM  Result Value Ref Range   Troponin I (High Sensitivity) 3 <18 ng/L  Wet prep, genital   Collection Time: 02/24/24 12:49 AM  Result Value Ref Range   Yeast Wet Prep HPF POC NONE SEEN NONE SEEN   Trich, Wet Prep NONE SEEN NONE SEEN   Clue Cells Wet Prep HPF POC NONE SEEN NONE SEEN   WBC, Wet Prep HPF POC <10 <10   Sperm NONE SEEN   Troponin I (High Sensitivity)   Collection Time: 02/24/24 12:50 AM  Result Value Ref Range   Troponin I (High Sensitivity) 4 <18 ng/L   CT ABDOMEN PELVIS W CONTRAST Result Date: 02/24/2024 CLINICAL DATA:  llq, supra pubic, rlq EXAM: CT ABDOMEN AND PELVIS WITH CONTRAST TECHNIQUE: Multidetector CT imaging of the abdomen and pelvis was performed using the standard protocol following bolus administration of intravenous contrast. RADIATION DOSE REDUCTION: This exam was performed according to the departmental dose-optimization program which includes automated exposure control, adjustment of the mA and/or kV according to patient size and/or use of iterative reconstruction technique. CONTRAST:  75mL  OMNIPAQUE IOHEXOL 350 MG/ML SOLN COMPARISON:  None Available. FINDINGS: Lower chest: No acute abnormality. Hepatobiliary: No focal liver abnormality. Status post cholecystectomy. No biliary dilatation. Pancreas: No focal lesion. Normal pancreatic contour. No surrounding inflammatory changes. No main pancreatic ductal dilatation. Spleen: Normal in size without focal abnormality. Adrenals/Urinary Tract: No adrenal nodule bilaterally. Bilateral kidneys enhance symmetrically. No hydronephrosis. No hydroureter. The urinary bladder is unremarkable. Stomach/Bowel: Stomach is within normal limits. No evidence of bowel wall thickening or dilatation. Appendix appears normal. Vascular/Lymphatic: No abdominal aorta or iliac aneurysm. No abdominal, pelvic, or inguinal lymphadenopathy. Reproductive: Uterus and bilateral adnexa are unremarkable. Other: No intraperitoneal free fluid. No intraperitoneal free gas. No organized fluid collection. Musculoskeletal: No abdominal wall hernia or abnormality. No suspicious lytic or blastic osseous lesions. No acute displaced fracture. IMPRESSION: No acute intra-abdominal or intrapelvic abnormality. Electronically Signed   By: Morgane  Naveau M.D.   On: 02/24/2024 01:29   DG Chest Portable 1 View Result Date: 02/23/2024 CLINICAL DATA:  Hypertension, malaise EXAM: PORTABLE CHEST 1 VIEW COMPARISON:  None Available. FINDINGS: The heart size and mediastinal contours are within normal limits. Both lungs are clear. The visualized skeletal structures are unremarkable. IMPRESSION: No active disease. Electronically Signed   By: Worthy Heads M.D.   On: 02/23/2024 22:47    CT without acute findings.  Wet prep negative.  Gc/chl pending.  OP BLE DVT study ordered.  Can follow-up with PCP.  Return here for new concerns.   Coretha Dew, PA-C 02/24/24 Elda Greener, April, MD 02/24/24 [phone]

## 2024-02-24 NOTE — Discharge Instructions (Addendum)
 Thank you for letting us  evaluate you today.  Your lab work was unremarkable.  Your heart enzyme was negative.  Your EKG is normal.  Your chest x-ray did not show any fluid or pneumonia.  Your CT scan  did not show any findings.  Set up an appointment for DVT study tomorrow to rule out blood clot in legs. Use tylenol  and ibuprofen  for pain  Follow up with cardiology for holter monitor and echocardiogram as was recommended by them at last appointment

## 2024-02-24 NOTE — Telephone Encounter (Signed)
 Attempted to contact the patient to schedule VAS US  - interpreter services used.  No answer.  Left message.  First Attempt. Provided  direct contact number for scheduling: [phone].

## 2024-02-25 ENCOUNTER — Ambulatory Visit (HOSPITAL_COMMUNITY)
Admission: RE | Admit: 2024-02-25 | Discharge: 2024-02-25 | Disposition: A | Discharge status: Home / Self Care | Source: Ambulatory Visit | Attending: Emergency Medicine | Admitting: Emergency Medicine

## 2024-02-25 DIAGNOSIS — H538 Other visual disturbances: Secondary | ICD-10-CM | POA: Diagnosis not present

## 2024-02-25 DIAGNOSIS — R002 Palpitations: Secondary | ICD-10-CM | POA: Insufficient documentation

## 2024-02-25 DIAGNOSIS — R1030 Lower abdominal pain, unspecified: Secondary | ICD-10-CM | POA: Insufficient documentation

## 2024-02-25 DIAGNOSIS — M79604 Pain in right leg: Secondary | ICD-10-CM | POA: Diagnosis present

## 2024-02-25 DIAGNOSIS — R609 Edema, unspecified: Secondary | ICD-10-CM | POA: Insufficient documentation

## 2024-02-25 DIAGNOSIS — M79605 Pain in left leg: Secondary | ICD-10-CM | POA: Insufficient documentation

## 2024-02-25 DIAGNOSIS — M79662 Pain in left lower leg: Secondary | ICD-10-CM | POA: Diagnosis not present

## 2024-02-25 DIAGNOSIS — M79661 Pain in right lower leg: Secondary | ICD-10-CM

## 2024-04-30 NOTE — Progress Notes (Unsigned)
 Cardiology Office Note   Date:  05/03/2024  ID:  Breanna, Simpson 1986-04-18, MRN [id number] PCP: Trudy Drinda LABOR, FNP  [facility] Providers Cardiologist:  Vina Gull, MD   History of Present Illness Breanna Simpson is a 38 y.o. female with a past history of chest pain and palpitations here for follow-up appointment.  She is followed by [facility].  She was seen by them January 2025.  History includes exercise routine in early 2024 with a treadmill an hour every other day.  She feels like she could still do this but had stopped.  Was making plans to enroll in exercise of classes at the gym.  Visit to PCP August 2024 and felt some chest pain over her left chest.  Fortunately very benign Zio patch and echo results which were reviewed at that time.  She described the fast heartbeats and then chest pain which followed.  Occurs when she is driving and she had to stop briefly, also has awaken her from her sleep.  Typically last about 7 minutes.  Occurs about once or twice a week.  She was started on metoprolol  succinate 25 mg daily to help with her palpitations.  Also struggles with hypertension.  Today, she presents with an episode of fast heartbeats and chest pain.  She experiences episodes of fast heartbeats and chest pain, occurring twice since her last visit, once in the morning and once in the middle of the night. These episodes now occur once or twice a month, which is less frequent than before. She takes metoprolol  25 mg daily, which has reduced the duration of these episodes.  Bilateral swelling in her feet and hands has been present for about a year and does not improve with elevation. She experiences breathing issues. She monitors her blood pressure at home. She does not take any diuretics for the swelling and does not actively monitor her salt intake.  Reports no shortness of breath nor dyspnea on exertion.  No orthopnea,  PND.  Discussed the use of AI scribe software for clinical note transcription with the patient, who gave verbal consent to proceed.   ROS: pertinent ROS in HPI  Studies Reviewed      Long term monitor 01/27/21 Predominant rhythm was normal sinus rhythm with average heart rate 71bpm and ranged from 47 to 128bpm. Occsaional PACs, PVCs and premature junctional beats.   Echo 02/11/21   1. Left ventricular ejection fraction, by estimation, is 65 to 70%. The  left ventricle has normal function. The left ventricle has no regional  wall motion abnormalities. Left ventricular diastolic parameters were  normal.   2. Right ventricular systolic function is normal. The right ventricular  size is normal. Tricuspid regurgitation signal is inadequate for assessing  PA pressure.   3. The mitral valve is normal in structure. No evidence of mitral valve  regurgitation. No evidence of mitral stenosis.   4. The aortic valve was not well visualized. Aortic valve regurgitation  is not visualized. No aortic stenosis is present.   5. The inferior vena cava is normal in size with greater than 50%  respiratory variability, suggesting right atrial pressure of 3 mmHg.        Physical Exam VS:  BP 120/78 (BP Location: Left Arm, Patient Position: Sitting, Cuff Size: Large)   Pulse 74   Resp 16   Ht 5' 3 (1.6 m)   SpO2 96%   BMI 39.83 kg/m  Wt Readings from Last 3 Encounters:  02/23/24 224 lb 13.9 oz (102 kg)  09/11/23 207 lb 10.8 oz (94.2 kg)  06/16/21 207 lb 9.6 oz (94.2 kg)    GEN: Well nourished, well developed in no acute distress NECK: No JVD; No carotid bruits CARDIAC: RRR, no murmurs, rubs, gallops RESPIRATORY:  Clear to auscultation without rales, wheezing or rhonchi  ABDOMEN: Soft, non-tender, non-distended EXTREMITIES:  No edema; No deformity   ASSESSMENT AND PLAN  Palpitations and chest pain Intermittent palpitations and chest pain reduced in duration with metoprolol  25 mg  daily. - Renew prescription for metoprolol  25 mg daily.  Bilateral lower extremity edema Chronic bilateral lower extremity edema with concern for cardiac function. No diuretic prescribed. - Order echocardiogram to assess heart pump function and valvular function. - Prescribe Lasix  20 mg as needed for swelling, with caution for kidney impact. - Advise to limit salt intake.   Hypertension Blood pressure well controlled with home monitoring. - Instruct to monitor blood pressure daily, one hour after taking medications. -Continue Avapro  150mg  daily   Dispo: She can follow-up with Dr. Okey in 6 months  Signed, Orren LOISE Fabry, PA-C

## 2024-05-03 ENCOUNTER — Ambulatory Visit: Attending: Cardiology | Admitting: Physician Assistant

## 2024-05-03 ENCOUNTER — Encounter: Payer: Self-pay | Admitting: Physician Assistant

## 2024-05-03 VITALS — BP 120/78 | HR 74 | Resp 16 | Ht 63.0 in

## 2024-05-03 DIAGNOSIS — R079 Chest pain, unspecified: Secondary | ICD-10-CM | POA: Diagnosis not present

## 2024-05-03 DIAGNOSIS — R600 Localized edema: Secondary | ICD-10-CM | POA: Insufficient documentation

## 2024-05-03 DIAGNOSIS — I10 Essential (primary) hypertension: Secondary | ICD-10-CM | POA: Insufficient documentation

## 2024-05-03 DIAGNOSIS — R002 Palpitations: Secondary | ICD-10-CM | POA: Insufficient documentation

## 2024-05-03 MED ORDER — FUROSEMIDE 20 MG PO TABS: 20.0000 mg | ORAL_TABLET | ORAL | 3 refills | Status: AC | PRN

## 2024-05-03 MED ORDER — METOPROLOL SUCCINATE ER 25 MG PO TB24: 25.0000 mg | ORAL_TABLET | Freq: Every day | ORAL | 3 refills | Status: AC

## 2024-05-03 MED ORDER — IRBESARTAN 150 MG PO TABS: 150.0000 mg | ORAL_TABLET | Freq: Every day | ORAL | 3 refills | Status: AC

## 2024-05-03 NOTE — Patient Instructions (Signed)
 Medication Instructions:  Metoprolol  Succinate (Toprol  XL) 25 mg once daily   Irbesartan  (Avapro ) 150 mg once daily   AS NEEDED Lasix  20 mg tablet for leg swelling Please call if you are taking it almost every day  Testing/Procedures: Your physician has requested that you have an echocardiogram. Echocardiography is a painless test that uses sound waves to create images of your heart. It provides your doctor with information about the size and shape of your heart and how well your heart's chambers and valves are working. This procedure takes approximately one hour. There are no restrictions for this procedure. Please do NOT wear cologne, perfume, aftershave, or lotions (deodorant is allowed). Please arrive 15 minutes prior to your appointment time.  Please note: We ask at that you not bring children with you during ultrasound (echo/ vascular) testing. Due to room size and safety concerns, children are not allowed in the ultrasound rooms during exams. Our front office staff cannot provide observation of children in our lobby area while testing is being conducted. An adult accompanying a patient to their appointment will only be allowed in the ultrasound room at the discretion of the ultrasound technician under special circumstances. We apologize for any inconvenience.   Follow-Up: At [facility], you and your health needs are our priority.  As part of our continuing mission to provide you with exceptional heart care, our providers are all part of one team.  This team includes your primary Cardiologist (physician) and Advanced Practice Providers or APPs (Physician Assistants and Nurse Practitioners) who all work together to provide you with the care you need, when you need it.  Your next appointment:   6 month(s)  Provider:   Vina Gull, MD    To learn more about what you can do with MyChart, go to [email or web address].   Other Instructions Blood Pressure Record Sheet To take  your blood pressure, you will need a blood pressure machine. You can buy a blood pressure machine (blood pressure monitor) at your clinic, drug store, or online. When choosing one, consider: An automatic monitor that has an arm cuff. A cuff that wraps snugly around your upper arm. You should be able to fit only one finger between your arm and the cuff. A device that stores blood pressure reading results. Do not choose a monitor that measures your blood pressure from your wrist or finger. Follow your health care provider's instructions for how to take your blood pressure. To use this form: Take your blood pressure medications every day These measurements should be taken when you have been at rest for at least 10-15 min Take at least 2 readings with each blood pressure check. This makes sure the results are correct. Wait 1-2 minutes between measurements. Write down the results in the spaces on this form. Keep in mind it should always be recorded systolic over diastolic. Both numbers are important.  Repeat this every day for 2-3 weeks, or as told by your health care provider.  Make a follow-up appointment with your health care provider to discuss the results.  Blood Pressure Log Date Medications taken? (Y/N) Blood Pressure Time of Day

## 2024-06-14 ENCOUNTER — Encounter (HOSPITAL_COMMUNITY): Payer: Self-pay | Admitting: Physician Assistant

## 2024-06-14 ENCOUNTER — Ambulatory Visit (HOSPITAL_COMMUNITY)
Admission: RE | Admit: 2024-06-14 | Source: Ambulatory Visit | Attending: Physician Assistant | Admitting: Physician Assistant

## 2024-08-02 ENCOUNTER — Emergency Department (HOSPITAL_COMMUNITY)

## 2024-08-02 ENCOUNTER — Emergency Department (HOSPITAL_COMMUNITY)
Admission: EM | Admit: 2024-08-02 | Discharge: 2024-08-03 | Disposition: A | Discharge status: Home / Self Care | Attending: Emergency Medicine | Admitting: Emergency Medicine

## 2024-08-02 DIAGNOSIS — R200 Anesthesia of skin: Secondary | ICD-10-CM | POA: Diagnosis not present

## 2024-08-02 DIAGNOSIS — R079 Chest pain, unspecified: Secondary | ICD-10-CM | POA: Diagnosis present

## 2024-08-02 DIAGNOSIS — I10 Essential (primary) hypertension: Secondary | ICD-10-CM | POA: Diagnosis not present

## 2024-08-02 DIAGNOSIS — Z79899 Other long term (current) drug therapy: Secondary | ICD-10-CM | POA: Diagnosis not present

## 2024-08-02 DIAGNOSIS — R519 Headache, unspecified: Secondary | ICD-10-CM | POA: Diagnosis not present

## 2024-08-02 LAB — HEPATIC FUNCTION PANEL
ALT: 17 U/L (ref 0–44)
AST: 18 U/L (ref 15–41)
Albumin: 3.7 g/dL (ref 3.5–5.0)
Alkaline Phosphatase: 56 U/L (ref 38–126)
Bilirubin, Direct: <0.1 mg/dL (ref 0.0–0.2)
Total Bilirubin: 0.4 mg/dL (ref 0.0–1.2)
Total Protein: 6.7 g/dL (ref 6.5–8.1)

## 2024-08-02 LAB — BASIC METABOLIC PANEL WITH GFR
Anion gap: 9 (ref 5–15)
BUN: 13 mg/dL (ref 6–20)
CO2: 24 mmol/L (ref 22–32)
Calcium: 9 mg/dL (ref 8.9–10.3)
Chloride: 108 mmol/L (ref 98–111)
Creatinine, Ser: 0.66 mg/dL (ref 0.44–1.00)
GFR, Estimated: >60 mL/min (ref >60)
Glucose, Bld: 95 mg/dL (ref 70–99)
Potassium: 3.9 mmol/L (ref 3.5–5.1)
Sodium: 141 mmol/L (ref 135–145)

## 2024-08-02 LAB — CBC
HCT: 40.3 % (ref 36.0–46.0)
Hemoglobin: 13.5 g/dL (ref 12.0–15.0)
MCH: 29.4 pg (ref 26.0–34.0)
MCHC: 33.5 g/dL (ref 30.0–36.0)
MCV: 87.8 fL (ref 80.0–100.0)
Platelets: 309 K/uL (ref 150–400)
RBC: 4.59 MIL/uL (ref 3.87–5.11)
RDW: 12.4 % (ref 11.5–15.5)
WBC: 6.7 K/uL (ref 4.0–10.5)
nRBC: 0 % (ref 0.0–0.2)

## 2024-08-02 LAB — LIPASE, BLOOD: Lipase: 33 U/L (ref 11–51)

## 2024-08-02 LAB — TROPONIN I (HIGH SENSITIVITY)
Troponin I (High Sensitivity): 3 ng/L (ref <18)
Troponin I (High Sensitivity): 3 ng/L (ref <18)

## 2024-08-02 LAB — HCG, SERUM, QUALITATIVE: Preg, Serum: NEGATIVE

## 2024-08-02 MED ORDER — IOHEXOL 350 MG/ML SOLN
100.0000 mL | Freq: Once | INTRAVENOUS | Status: AC | PRN
  Administered 2024-08-02: 100 mL via INTRAVENOUS

## 2024-08-02 MED ORDER — GADOBUTROL 1 MMOL/ML IV SOLN
10.0000 mL | Freq: Once | INTRAVENOUS | Status: AC | PRN
  Administered 2024-08-02: 10 mL via INTRAVENOUS

## 2024-08-02 MED ORDER — ACETAMINOPHEN 500 MG PO TABS
1000.0000 mg | ORAL_TABLET | Freq: Once | ORAL | Status: AC
  Administered 2024-08-02: 1000 mg via ORAL
  Filled 2024-08-02: qty 2

## 2024-08-02 NOTE — ED Notes (Signed)
 CCMD contacted to monitor pt

## 2024-08-02 NOTE — Discharge Instructions (Addendum)
 MRI is normal.  No evidence of stroke.  The abnormality seen on CT scan is not appreciated on MRA and is likely an artifact. Follow-up with your primary care doctor regarding incidental finding on your CT scan abdomen pelvis below.  Stable 2.2 x 1.5 cm right anterior pelvic wall subcutaneous mass with adjacent stranding; differential considerations include abdominal wall endometrioma, post-traumatic hematoma/scar, desmoid tumor, or a resolved infectious process. Assess for cyclical pain related to menses.  You may need further monitoring for this.  Otherwise recommend rest hydration.  Can follow-up with your neurology team if headaches are persistent and not controlled.  Talk to your primary care doctor about this.

## 2024-08-02 NOTE — ED Triage Notes (Signed)
 Pt BIB ems from home. Pt speaks Arabic.   Pt complains of chest pain with left arm numbness and hypertension x 2 days. Today started to have chills and diarrhea.   EMS VS 220/110 then 140/70 HR 84 100% RA Cbg 136 20g LAC

## 2024-08-02 NOTE — ED Provider Notes (Signed)
 [place] AT [facility] Provider Note   CSN: [id number] Arrival date & time: 08/02/24  1815     Patient presents with: Chest Pain   Breanna Simpson is a 38 y.o. female.   Patient here with headache chest pain left arm numbness high blood pressure last couple days.  Felt like symptoms started to get worse today.  Husband is here as well.  History of the same per husband.  When her blood pressure gets high she will get the symptoms at times.  She has a history of hypertension high cholesterol dizziness and headaches.  She denies any recent surgery or travel.  No blood clot history.  Is not on any estrogen.  Is not having any shortness of breath.  She started to feel better.  Denies any vision changes speech changes weakness.  She denies history of complex migraines but does have headache history.  Headache feels slightly worse than her normal headaches that are bad.  The history is provided by the patient. A language interpreter was used.       Prior to Admission medications   Medication Sig Start Date End Date Taking? Authorizing Provider  cetirizine (ZYRTEC) 10 MG tablet Take 10 mg by mouth daily. 12/13/20   [provider]  furosemide  (LASIX ) 20 MG tablet Take 1 tablet (20 mg total) by mouth as needed. 05/03/24   Lucien Orren SAILOR, PA-C  hydrocortisone 2.5 % cream Apply 1 Application topically 2 (two) times daily.    [provider]  ibuprofen  (ADVIL ) 600 MG tablet Take 600 mg by mouth every 8 (eight) hours as needed. 11/02/22   [provider]  irbesartan  (AVAPRO ) 150 MG tablet Take 1 tablet (150 mg total) by mouth daily. 05/03/24   Conte, Tessa N, PA-C  metoprolol  succinate (TOPROL -XL) 25 MG 24 hr tablet Take 1 tablet (25 mg total) by mouth daily. Take with or immediately following a meal. 05/03/24   Lucien Orren SAILOR, PA-C  nortriptyline  (PAMELOR ) 10 MG capsule Take 1 capsule (10 mg total) by mouth at bedtime. 03/07/21   Skeet Juliene SAUNDERS,  DO    Allergies: Patient has no known allergies.    Review of Systems  Updated Vital Signs BP 138/82   Pulse 73   Temp 98.7 F (37.1 C) (Oral)   Resp 18   SpO2 100%   Physical Exam Vitals and nursing note reviewed.  Constitutional:      General: She is not in acute distress.    Appearance: She is well-developed. She is not ill-appearing.  HENT:     Head: Normocephalic and atraumatic.  Eyes:     Extraocular Movements: Extraocular movements intact.     Conjunctiva/sclera: Conjunctivae normal.     Pupils: Pupils are equal, round, and reactive to light.  Cardiovascular:     Rate and Rhythm: Normal rate and regular rhythm.     Pulses:          Radial pulses are 2+ on the right side and 2+ on the left side.     Heart sounds: Normal heart sounds. No murmur heard. Pulmonary:     Effort: Pulmonary effort is normal. No respiratory distress.     Breath sounds: Normal breath sounds. No decreased breath sounds, wheezing or rhonchi.  Abdominal:     Palpations: Abdomen is soft.     Tenderness: There is no abdominal tenderness.  Musculoskeletal:        General: No swelling.     Cervical  back: Normal range of motion and neck supple.  Skin:    General: Skin is warm and dry.     Capillary Refill: Capillary refill takes less than 2 seconds.  Neurological:     General: No focal deficit present.     Mental Status: She is alert and oriented to person, place, and time.     Cranial Nerves: No cranial nerve deficit.     Motor: No weakness.     Comments: 5+ out of 5 strength throughout, normal sensation, no drift, normal finger-to-nose finger, normal speech poor effort with strength exam in her legs but the strength appears to be symmetric when I touch her left arm and right arm she says they feel the same visual fields are intact  Psychiatric:        Mood and Affect: Mood normal.     (all labs ordered are listed, but only abnormal results are displayed) Labs Reviewed  BASIC METABOLIC  PANEL WITH GFR  CBC  HCG, SERUM, QUALITATIVE  LIPASE, BLOOD  HEPATIC FUNCTION PANEL  I-STAT CHEM 8, ED  TROPONIN I (HIGH SENSITIVITY)  TROPONIN I (HIGH SENSITIVITY)    EKG: EKG Interpretation Date/Time:  Wednesday August 02 2024 18:23:27 EDT Ventricular Rate:  66 PR Interval:  125 QRS Duration:  82 QT Interval:  403 QTC Calculation: 423 R Axis:   71  Text Interpretation: Sinus rhythm Confirmed by Ruthe Cornet [phone]) on 08/02/2024 6:40:43 PM  Radiology: CT ANGIO HEAD NECK W WO CM Result Date: 08/02/2024 EXAM: CTA Head and Neck with Intravenous Contrast. CT Head without Contrast. CLINICAL HISTORY: headache, dizziness. TECHNIQUE: Axial CTA images of the head and neck performed with intravenous contrast. MIP reconstructed images were created and reviewed. Axial computed tomography images of the head/brain performed without intravenous contrast. Note: Per PQRS, the description of internal carotid artery percent stenosis, including 0 percent or normal exam, is based on [employer] Symptomatic Carotid Endarterectomy Trial (NASCET) criteria. Dose reduction technique was used including one or more of the following: automated exposure control, adjustment of mA and kV according to patient size, and/or iterative reconstruction. CONTRAST: Without and with; 100 mL (iohexol  (OMNIPAQUE ) 350 MG/ML injection 100 mL IOHEXOL  350 MG/ML SOLN). COMPARISON: None provided. FINDINGS: CT HEAD: BRAIN: No acute intraparenchymal hemorrhage. No mass lesion. No CT evidence for acute territorial infarct. No midline shift or extra-axial collection. VENTRICLES: No hydrocephalus. ORBITS: The orbits are unremarkable. SINUSES AND MASTOIDS: The paranasal sinuses and mastoid air cells are clear. CTA NECK: COMMON CAROTID ARTERIES: No significant stenosis. No dissection or occlusion. INTERNAL CAROTID ARTERIES: No stenosis by NASCET criteria. No dissection or occlusion. VERTEBRAL ARTERIES: No significant stenosis. No  dissection or occlusion. CTA HEAD: ANTERIOR CEREBRAL ARTERIES: No significant stenosis. No occlusion. No aneurysm. MIDDLE CEREBRAL ARTERIES: Irregularity about the right M1 segment with associated mild to moderate narrowing (series 13, image 22). These findings could potentially be artifactual in nature, possible small atherosclerotic stenoses or changes of vasculitis could also be considered. The left M1 segment shows no significant stenosis, occlusion, or aneurysm. POSTERIOR CEREBRAL ARTERIES: No significant stenosis. No occlusion. No aneurysm. BASILAR ARTERY: No significant stenosis. No occlusion. No aneurysm. OTHER: SOFT TISSUES: No acute finding. No masses or lymphadenopathy. BONES: No acute osseous abnormality. IMPRESSION: 1. Negative CTA for large vessel occlusion. 2. Apparent irregularity of the right M1 segment with associated mild to moderate narrowing. While these findings could potentially be artifactual in nature, possible atherosclerotic stenoses or changes of vasculitis could also be considered. 3. No  other hemodynamically significant or correctable stenosis about the major arterial vasculature of the head and neck. 4. No other acute intracranial abnormality. Electronically signed by: Morene Hoard MD 08/02/2024 09:52 PM EDT RP Workstation: [id number]   CT Angio Chest/Abd/Pel for Dissection W and/or Wo Contrast Result Date: 08/02/2024 EXAM: CTA CHEST, ABDOMEN AND PELVIS WITH AND WITHOUT CONTRAST 08/02/2024 08:37:44 PM TECHNIQUE: CTA of the chest was performed with and without the administration of intravenous contrast. CTA of the abdomen and pelvis was performed with the administration of intravenous contrast. Multiplanar reformatted images are provided for review. MIP images are provided for review. Automated exposure control, iterative reconstruction, and/or weight based adjustment of the mA/kV was utilized to reduce the radiation dose to as low as reasonably achievable. COMPARISON:  Portable chest film from 08/02/2024, CT abdomen and pelvis with IV contrast from 02/24/2024, and portable chest from 02/23/2024. CLINICAL HISTORY: Acute aortic syndrome (AAS) suspected. FINDINGS: VASCULATURE: AORTA: The thoracic and abdominal aorta are normal in course, caliber, and patency. There are no appreciable aortic plaques. There is no aneurysm, stenosis, dissection, intramural hematoma, or penetrating ulcer. PULMONARY ARTERIES: The pulmonary arteries and veins are normal caliber. No pulmonary arterial embolism is seen on this non-dedicated study. GREAT VESSELS OF AORTIC ARCH: No acute finding. No dissection. No arterial occlusion or significant stenosis. CELIAC TRUNK: No acute finding. No occlusion or significant stenosis. SUPERIOR MESENTERIC ARTERY: No acute finding. No occlusion or significant stenosis. INFERIOR MESENTERIC ARTERY: No acute finding. No occlusion or significant stenosis. RENAL ARTERIES: No acute finding. No occlusion or significant stenosis. ILIAC ARTERIES: There are trace calcific plaques in the common iliac and internal iliac arteries but no flow limiting stenosis, occlusion, dissection, or aneurysm. CHEST: MEDIASTINUM: The cardiac size is normal. There is no substantial pericardial effusion. No intrathoracic adenopathy. The heart and pericardium demonstrate no acute abnormality. Thoracic esophagus unremarkable. LUNGS AND PLEURA: The lungs are clear of infiltrates and nodules. There is linear scarring or atelectasis again noted in the lingular base. There is no pleural effusion, thickening, or pneumothorax. There is mild elevation of the right hemidiaphragm. The trachea and central bronchi are clear. THORACIC BONES AND SOFT TISSUES: No acute bone or soft tissue abnormality. No axillary adenopathy. ABDOMEN AND PELVIS: LIVER: The liver is mildly steatotic measuring 20 cm in length, without mass enhancement. GALLBLADDER AND BILE DUCTS: The gallbladder is again noted absent without bile duct  dilatation. SPLEEN: The spleen is unremarkable. PANCREAS: The pancreas is unremarkable. ADRENAL GLANDS: Bilateral adrenal glands demonstrate no acute abnormality. KIDNEYS, URETERS AND BLADDER: No stones in the kidneys or ureters. No hydronephrosis. No perinephric or periureteral stranding. Urinary bladder is unremarkable. GI AND BOWEL: The stomach is filled with food products and has a normal wall thickness. The unopacified small bowel is normal in caliber without inflammatory changes. The appendix is normal. Scattered colonic diverticulosis without evidence of colitis or diverticulitis. There is no bowel obstruction. No abnormal bowel wall thickening or distension. REPRODUCTIVE: The uterus and ovaries are unremarkable apart from a dominant 2.6 cm left ovarian follicle. No specific follow-up for this is recommended. PERITONEUM AND RETROPERITONEUM: No ascites or free air. LYMPH NODES: No lymphadenopathy. ABDOMINAL BONES AND SOFT TISSUES: There is a small umbilical fat hernia. There are no incarcerated hernias. There is a stable 2.2 x 1.5 cm mass overlying the right anterior pelvic wall in the deep subcutaneous plane with adjacent stranding. . This could be due to an abdominal wall endometrioma, old trauma with hematoma and scarring, desmoid tumor of the abdominal  wall, or a burned out infectious process. There are potential neoplastic etiologies, but neoplasm would not be expected to remain unchanged for over 5 months. Historical correlation recommended for any signs or symptoms of cyclical pain in the area related to menses. The abdominal wall is otherwise unremarkable. There are early degenerative changes of the lumbar spine. Mild bilateral chronic nonerosive sacroiliitis. At L5-S1, there is a left paracentral disc protrusion impinging on the left S1 nerve root. Follow up with MRI if clinically warranted. No acute other significant osseous findings. IMPRESSION: 1. No evidence of acute aortic syndrome. CTA study is  negative apart from trace calcific plaques in the common iliac and internal iliac arteries nonstenosing. . 2. Stable 2.2 x 1.5 cm right anterior pelvic wall subcutaneous mass with adjacent stranding; differential considerations include abdominal wall endometrioma, post-traumatic hematoma/scar, desmoid tumor, or a resolved infectious process. Assess for cyclical pain related to menses. 3. Left paracentral disc protrusion at L5-S1 impinging on the left S1 nerve root. Consider lumbar spine MRI if indicated. 4. Remaining findings described above . Electronically signed by: Francis Quam MD 08/02/2024 09:14 PM EDT RP Workstation: [id number]   DG Chest Portable 1 View Result Date: 08/02/2024 CLINICAL DATA:  Chest pain. EXAM: PORTABLE CHEST 1 VIEW COMPARISON:  10/25/2023 FINDINGS: The cardiomediastinal contours are normal. The lungs are clear. Pulmonary vasculature is normal. No consolidation, pleural effusion, or pneumothorax. No acute osseous abnormalities are seen. IMPRESSION: No active disease. Electronically Signed   By: Andrea Gasman M.D.   On: 08/02/2024 18:54     Procedures   Medications Ordered in the ED  acetaminophen  (TYLENOL ) tablet 1,000 mg (1,000 mg Oral Given 08/02/24 1930)  iohexol  (OMNIPAQUE ) 350 MG/ML injection 100 mL (100 mLs Intravenous Contrast Given 08/02/24 2038)                                    Medical Decision Making Amount and/or Complexity of Data Reviewed Labs: ordered. Radiology: ordered.  Risk OTC drugs. Prescription drug management.   Shalena Ezzell is here with headache high blood pressure chest pain.  Normal vitals.  No fever.  History of hypertension high cholesterol headaches.  Overall EKG shows sinus rhythm per my review and interpretation.  No ischemic changes.  Differential diagnosis complex migraine versus less likely dissection and stroke head bleed electrolyte abnormality.  Her neurological exam is overall normal.  She does some poor effort and  strength exam in her legs but it is symmetric.  She says her left arm feels tingly but when I touch her arms she says they feel the same both sides.  Her visual fields are intact.  Husband states that she has similar episodes in the past related to headaches.  Has not had an MRI.  She is PERC negative and doubt PE.  Overall I have low suspicion for dissection but will get a CTA of the head and neck and do CTA of the chest abdomen pelvis as well.  Will likely consider MRI given the numbness.  Will check basic labs including troponin.  Will give Tylenol  for headache she does not want anything more sedating at this time.  Overall lab work showed no significant leukocytosis anemia or electrolyte abnormality.  Troponin negative x 2.  CT dissection study of the chest abdomen pelvis is unremarkable.  She has ongoing stable incidental finding of a 2.2 x 1.5 cm right anterior pelvic wall subcutaneous mass.  She  has had history of C-sections in the past and could be related to that.  Does not seem to be related to menstrual cycles.  Overall I made patient and her family member aware of this to discuss with primary care doctor.  Moreover given that this is completely unchanged from 5 months earlier seems less likely to be neoplasm per radiology report.  But I did recommend that they follow-up with the primary care doctor to continue to follow this along.  Otherwise a CTA of the head and neck showed no large vessel occlusion.  There was maybe some irregularity of the right M1 segment but could be artifactual versus stenosis or maybe changes of vasculitis.  I had Dr. Michaela with neurology evaluate the images and he believes that this looks like it is artifact.  But ultimately patient is going for MRI of the brain with and without contrast to further evaluate for stroke and other etiologies of her headache and symptoms.  We will add an MRA to see if we can rule out any vessel disease and see if we can get a better picture  to prove that this is artifact.  Ultimately she is feeling better.  I anticipate discharge and neurology follow-up if MRIs are unremarkable.  Even if there is some irregularity of the right M1 segment she can follow-up with neurology and primary care about this as well.  Patient feeling better.  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  Please see oncoming ED staff note for further results evaluation and disposition of the patient.  This chart was dictated using voice recognition software.  Despite best efforts to proofread,  errors can occur which can change the documentation meaning.      Final diagnoses:  Nonspecific chest pain  Nonintractable headache, unspecified chronicity pattern, unspecified headache type  Numbness    ED Discharge Orders     None          Ruthe Cornet, DO 08/02/24 2317

## 2024-08-03 MED ORDER — GADOBUTROL 1 MMOL/ML IV SOLN: 10.0000 mL | Freq: Once | INTRAVENOUS | Status: DC | PRN

## 2024-08-03 NOTE — ED Provider Notes (Signed)
 Care assumed from Dr. Ruthe.  Awaiting MRI brain given abnormalities on CTA.  Neurology favors this to be artifact.  Discussed with Dr. Michaela.  MRI negative for stroke.  Irregularity of M1 segment was thought to be artifact is not seen on MRA.  Troponin negative with low concern for ACS.  PERC negative.  Reassuring workup as above.  Headache has improved.  Still complaining of some numbness to her left arm but no weakness.SABRA  MRI negative for infarct.  Abnormality on CTA not appreciated on MRA.  Will have her follow-up with PCP as well as neurology.   Carita Senior, MD 08/03/24 [phone]

## 2024-08-29 ENCOUNTER — Encounter: Payer: Self-pay | Admitting: Internal Medicine

## 2024-10-02 ENCOUNTER — Encounter: Payer: Self-pay | Admitting: Neurology

## 2024-10-02 ENCOUNTER — Ambulatory Visit: Admitting: Neurology

## 2024-10-02 VITALS — BP 137/84 | HR 72 | Ht 63.0 in | Wt 227.0 lb

## 2024-10-02 DIAGNOSIS — R519 Headache, unspecified: Secondary | ICD-10-CM | POA: Diagnosis not present

## 2024-10-02 DIAGNOSIS — R202 Paresthesia of skin: Secondary | ICD-10-CM | POA: Diagnosis not present

## 2024-10-02 DIAGNOSIS — Z9189 Other specified personal risk factors, not elsewhere classified: Secondary | ICD-10-CM

## 2024-10-02 DIAGNOSIS — M79602 Pain in left arm: Secondary | ICD-10-CM

## 2024-10-02 NOTE — Patient Instructions (Addendum)
 It was nice to meet you today.   As discussed, your headaches are likely due to a combination of factors.   Here is what we discussed today and my recommendations for you:   We will do an EMG and nerve conduction velocity test, which is an electrical nerve and muscle test, which we will schedule. We will call you with the results. Please remember, common headache triggers are: sleep deprivation, dehydration, overheating, stress, hypoglycemia or skipping meals and blood sugar fluctuations, excessive pain medications or excessive alcohol use or caffeine  withdrawal. Some people have food triggers such as aged cheese, orange juice or chocolate, especially dark chocolate, or MSG (monosodium glutamate). Try to avoid these headache triggers as much possible. It may be helpful to keep a headache diary to figure out what makes your headaches worse or brings them on and what alleviates them. Some people report headache onset after exercise but studies have shown that regular exercise may actually prevent headaches from coming. If you have exercise-induced headaches, please make sure that you drink plenty of fluid before and after exercising and that you do not over do it and do not overheat. You can use tylenol  as needed.  Reduce your caffeine  to 1 cup/day, as caffeine  can drive headaches.  Please increase your water intake to about 8 cups of water per day. Please get an updated eye exam. You can seen any optometrist or ophthalmologist of your choosing. Strain on the eyes can exacerbate headaches. I will order a sleep study to look for signs of obstructive sleep apnea (aka OSA). As explained, the long-term risks and ramifications of untreated moderate to severe obstructive sleep apnea may include (but are not limited to): increased risk for cardiovascular disease, including congestive heart failure, stroke, difficult to control hypertension, treatment resistant obesity, arrhythmias, especially irregular heartbeat  commonly known as A. Fib. (atrial fibrillation); even type 2 diabetes has been linked to untreated OSA.  We will check blood work today and call you with the test results. We will plan a follow as needed after testing.

## 2024-10-02 NOTE — Progress Notes (Signed)
 Subjective:    Patient ID: Breanna Simpson is a 38 y.o. female.  HPI    True Mar, MD, PhD [facility] [address] [address] [place], [place] [address]  I saw patient, Breanna Simpson, as a referral from the emergency room for evaluation of her headaches.  The patient is accompanied by an Arabic interpreter today.  [name] is a 38 year old female with an underlying medical history of chest pain, palpitations, headaches, hypertension, hyperlipidemia, and severe obesity with a BMI of over 40, who reports recurrent headaches which are not currently bothersome to her.  She has headache about twice a week.  She has woken up with a headache as well recently.  She is bothered by tingling and pain in her left hand.  This has been going on for 3 months.  She reports an aching and soreness in her left elbow area and above the elbow in the muscle and also a stinging pain or burning sensation in digits 1 and 2 of her left hand.  She is not sure if she did anything to bring this on, no obvious trauma.  She has not been sleeping well.  She does not hydrate well with water and estimates that she drinks about 2 cups of liquids per day after drinking 2 cups of coffee in the mornings.  She does not like to drink water.  She snores very loudly according to her husband.  She has not been told that she stops breathing while asleep.  She has not noticed weakness in her left hand or elsewhere. She presented to the emergency room on 08/02/2024 with a chief complaint of chest pain.  She also reported a headache at the time.  She reported left arm numbness.  I reviewed the emergency room records.  She was treated symptomatically with acetaminophen .  She had a brain MRI with and without contrast on 08/02/2024 and I reviewed the results:  IMPRESSION: 1. Normal brain MRI. No acute intracranial abnormality.   In addition, I personally and independently reviewed images through the PACS  system.    She had an MR angiogram of the head without contrast on 08/02/2024 and I reviewed the results:   IMPRESSION: 1. Normal intracranial MRA. 2. Previously noted irregularity about the right M1 segment is not seen. This was consistent with artifact on the prior exam.   She had a CT angiogram of the head and neck with and without contrast on 08/02/2024 and I reviewed the results:   IMPRESSION: 1. Negative CTA for large vessel occlusion. 2. Apparent irregularity of the right M1 segment with associated mild to moderate narrowing. While these findings could potentially be artifactual in nature, possible atherosclerotic stenoses or changes of vasculitis could also be considered. 3. No other hemodynamically significant or correctable stenosis about the major arterial vasculature of the head and neck. 4. No other acute intracranial abnormality.   Of note, she has had abdominal pain.  She saw her PCP on 09/20/2024 and I reviewed the electronic record and care everywhere.  She takes ibuprofen  600 mg as needed.  She was referred to GYN for a uterine mass.  She had seen Dr. Skeet with [facility] neurology in 2022 for headaches.  She was not felt to have migraines at the time.  She was started on low-dose nortriptyline  at the time. She had a prior brain MRI with and without contrast on 02/21/2021 and I reviewed the results:  IMPRESSION: Normal MRI head with contrast.  She had interim weight gain.  She has never had a sleep study.  She initially did not recall seeing neurology in the past and did not go back for follow-up.  She has not had a recent eye examination, in fact has not seen an eye doctor in years.    Her Past Medical History Is Significant For: Past Medical History:  Diagnosis Date   Chest pain    Dizziness    Headache    Hyperlipidemia    Hypertension     Her Past Surgical History Is Significant For: History reviewed. No pertinent surgical history.  Her Family  History Is Significant For: Family History  Problem Relation Age of Onset   Hypertension Mother    Cancer - Colon Father     Her Social History Is Significant For: Social History   Socioeconomic History   Marital status: Married    Spouse name: Not on file   Number of children: Not on file   Years of education: Not on file   Highest education level: Not on file  Occupational History   Not on file  Tobacco Use   Smoking status: Never   Smokeless tobacco: Never  Vaping Use   Vaping status: Never Used  Substance and Sexual Activity   Alcohol use: Never   Drug use: Never   Sexual activity: Not Currently  Other Topics Concern   Not on file  Social History Narrative   Patient is not currently employed,   Patient lives with husband and children.    Social Drivers of Health   Tobacco Use: Low Risk (10/02/2024)   Patient History    Smoking Tobacco Use: Never    Smokeless Tobacco Use: Never    Passive Exposure: Not on file  Financial Resource Strain: Not on File (01/22/2022)   Received from [employer]    Financial Resource Strain: 0  Food Insecurity: Not at Risk (01/24/2024)   Received from [facility] Insecurity    Within the past 12 months, you worried that your food would run out before you got money to buy more.: 1  Transportation Needs: Not at Risk (01/24/2024)   Received from [facility] Needs    In the past 12 months, has lack of transportation kept you from medical appointments, meetings, work or from getting things needed for daily living?: 1  Physical Activity: Not on File (07/27/2022)   Received from [facility]   Physical Activity    Physical Activity: 0  Stress: Not on File (01/22/2022)   Received from [facility]   Stress    Stress: 0  Social Connections: Not on File (06/08/2023)   Received from [facility]   Social Connections    Connectedness: 0  Depression (PHQ2-9): Not on file  Alcohol Screen: Not on file  Housing: Not on file   Utilities: Not on file  Health Literacy: Not on file    Her Allergies Are:  Allergies[1]:   Her Current Medications Are:  Outpatient Encounter Medications as of 10/02/2024  Medication Sig   cetirizine (ZYRTEC) 10 MG tablet Take 10 mg by mouth daily.   furosemide  (LASIX ) 20 MG tablet Take 1 tablet (20 mg total) by mouth as needed.   hydrocortisone 2.5 % cream Apply 1 Application topically 2 (two) times daily.   ibuprofen  (ADVIL ) 600 MG tablet Take 600 mg by mouth every 8 (eight) hours as needed.   irbesartan  (AVAPRO ) 150 MG tablet Take 1 tablet (150 mg  total) by mouth daily.   metoprolol  succinate (TOPROL -XL) 25 MG 24 hr tablet Take 1 tablet (25 mg total) by mouth daily. Take with or immediately following a meal.   nortriptyline  (PAMELOR ) 10 MG capsule Take 1 capsule (10 mg total) by mouth at bedtime.   No facility-administered encounter medications on file as of 10/02/2024.  :   Review of Systems:  Out of a complete 14 point review of systems, all are reviewed and negative with the exception of these symptoms as listed below:   Review of Systems  Objective:  Neurological Exam  Physical Exam Physical Examination:   Vitals:   10/02/24 1506  BP: 137/84  Pulse: 72    General Examination: The patient is a very pleasant 38 y.o. female in no acute distress. She appears well-developed and well-nourished and well groomed.   HEENT: Normocephalic, atraumatic, pupils are equal, round and reactive to light, extraocular tracking is good without limitation to gaze excursion or nystagmus noted. No photophobia.  Funduscopic exam benign. No Corrective eye glasses in place. Hearing is grossly intact.  Face is symmetric with normal facial animation and normal facial sensation to light touch, temperature and vibration sense. Speech is clear without dysarthria. There is no hypophonia. There is no lip, neck/head, jaw or voice tremor. Neck is supple with full range of passive and active motion.  There are no carotid bruits on auscultation.  Airway/Oropharynx exam reveals: mild mouth dryness, adequate dental hygiene and mild airway crowding, due to small airway entry, Mallampati class III.  Tongue protrudes centrally and palate elevates symmetrically.  Chest: Clear to auscultation without wheezing, rhonchi or crackles noted.  Heart: S1+S2+0, regular and normal without murmurs, rubs or gallops noted.   Abdomen: Soft, non-tender and non-distended.  Extremities: There is no pitting edema in the distal lower extremities bilaterally.   Skin: Warm and dry without trophic changes noted.   Musculoskeletal: exam reveals no obvious joint deformities.   Neurologically:  Mental status: The patient is awake, alert and oriented in all 4 spheres. Her immediate and remote memory, attention, language skills and fund of knowledge are appropriate. There is no evidence of aphasia, agnosia, apraxia or anomia. Speech is clear with normal prosody and enunciation. Thought process is linear. Mood is normal and affect is normal.  Cranial nerves II - XII are as described above under HEENT exam.  Motor exam: Normal bulk, strength and tone is noted. There is no obvious action or resting tremor.  No drift or rebound. Negative Tinel's and negative Phalen's bilaterally.  Reports soreness when bending her left elbow and when tapping thumb and index finger on the left. Fine motor skills and coordination: Intact finger taps, hand movements and foot taps bilaterally.  Reflexes 1-2+  throughout, toes are downgoing bilaterally. Romberg negative. Cerebellar testing: No dysmetria or intention tremor. There is no truncal or gait ataxia.  Normal finger-to-nose, normal heel-to-shin bilaterally. Sensory exam: intact to light touch in the upper and lower extremities.  Gait, station and balance: She stands easily. No veering to one side is noted. No leaning to one side is noted. Posture is age-appropriate and stance is narrow  based. Gait shows normal stride length and normal pace. No problems turning are noted.  Normal tandem walk.  Assessment and Plan:   In summary, Neri Vieyra is a very pleasant 38 y.o.-year old female 38 year old female with an underlying medical history of chest pain, palpitations, headaches, hypertension, hyperlipidemia, and severe obesity with a BMI of over 40, who presents  for evaluation of her recurrent headaches of several years duration as well as approximately 37-month history of pain affecting her left arm, as well as paresthesias affecting her left hand.  Neurological exam is largely nonfocal with the exception that she has some soreness in her left elbow area as well as left hand digits 1 and 2 but no telltale signs of ulnar neuropathy or median neuropathy.  Nevertheless, further evaluation is recommended.  She is advised to follow-up with her PCP to discuss the possibility of evaluation through rheumatology and/orthopedics as she may have joint related issues and connective tissue related issues underlying.  We will do some blood work today.  This was an extended visit of over 60 minutes with copious record review involved, including prior neurology record review, personal review of images, ER record review, comprehensive exam, and considerable counseling and coordination of care with addressing multiple issues.   Below is a summary of my recommendations and our discussion points from today's visit, based on chart review, history and examination. They were given these instructions verbally during the visit in detail and also in writing in the MyChart after visit summary (AVS), which they can access electronically. << We will do an EMG and nerve conduction velocity test, which is an electrical nerve and muscle test, which we will schedule. We will call you with the results. Please remember, common headache triggers are: sleep deprivation, dehydration, overheating, stress, hypoglycemia or skipping  meals and blood sugar fluctuations, excessive pain medications or excessive alcohol use or caffeine  withdrawal. Some people have food triggers such as aged cheese, orange juice or chocolate, especially dark chocolate, or MSG (monosodium glutamate). Try to avoid these headache triggers as much possible. It may be helpful to keep a headache diary to figure out what makes your headaches worse or brings them on and what alleviates them. Some people report headache onset after exercise but studies have shown that regular exercise may actually prevent headaches from coming. If you have exercise-induced headaches, please make sure that you drink plenty of fluid before and after exercising and that you do not over do it and do not overheat. You can use tylenol  as needed.  Reduce your caffeine  to 1 cup/day, as caffeine  can drive headaches.  Please increase your water intake to about 8 cups of water per day. Please get an updated eye exam. You can seen any optometrist or ophthalmologist of your choosing. Strain on the eyes can exacerbate headaches. I will order a sleep study to look for signs of obstructive sleep apnea (aka OSA). As explained, the long-term risks and ramifications of untreated moderate to severe obstructive sleep apnea may include (but are not limited to): increased risk for cardiovascular disease, including congestive heart failure, stroke, difficult to control hypertension, treatment resistant obesity, arrhythmias, especially irregular heartbeat commonly known as A. Fib. (atrial fibrillation); even type 2 diabetes has been linked to untreated OSA.  We will check blood work today and call you with the test results. We will plan a follow as needed after testing.   >>    True Mar, MD, PhD      [1] No Known Allergies

## 2024-10-03 ENCOUNTER — Ambulatory Visit: Payer: Self-pay | Admitting: Neurology

## 2024-10-05 LAB — HGB A1C W/O EAG: Hgb A1c MFr Bld: 5.4 % (ref 4.8–5.6)

## 2024-10-05 LAB — SEDIMENTATION RATE: Sed Rate: 22 mm/h (ref 0–32)

## 2024-10-05 LAB — TSH: TSH: 1.34 u[IU]/mL (ref 0.450–4.500)

## 2024-10-05 LAB — CK: Total CK: 58 U/L (ref 32–182)

## 2024-10-05 LAB — B12 AND FOLATE PANEL
Folate: 12.5 ng/mL
Vitamin B-12: 940 pg/mL (ref 232–1245)

## 2024-10-05 LAB — VITAMIN B1: Thiamine: 117.8 nmol/L (ref 66.5–200.0)

## 2024-10-05 LAB — VITAMIN D 25 HYDROXY (VIT D DEFICIENCY, FRACTURES): Vit D, 25-Hydroxy: 40.1 ng/mL (ref 30.0–100.0)

## 2024-10-05 LAB — RHEUMATOID FACTOR: Rheumatoid fact SerPl-aCnc: <10 [IU]/mL

## 2024-10-13 ENCOUNTER — Telehealth: Payer: Self-pay | Admitting: Neurology

## 2024-10-13 NOTE — Telephone Encounter (Signed)
 NPSG [facility] blue pending.

## 2024-10-16 NOTE — Telephone Encounter (Signed)
 HST [facility] blue pending NPSG denied. See below for the denial.

## 2024-10-17 NOTE — Telephone Encounter (Signed)
 HST [facility] blue no auth req via fax

## 2024-12-05 ENCOUNTER — Encounter: Payer: Self-pay | Admitting: Obstetrics and Gynecology

## 2024-12-27 ENCOUNTER — Encounter: Admitting: Neurology
